# Patient Record
Sex: Female | Born: 1985 | Race: Black or African American | Hispanic: No | Marital: Single | Smoking: Never smoker
Health system: Southern US, Community
[De-identification: ages and names within clinical notes are randomized; demographics above are authoritative.]

## PROBLEM LIST (undated history)

## (undated) DIAGNOSIS — A64 Unspecified sexually transmitted disease: Secondary | ICD-10-CM

## (undated) HISTORY — PX: TUBAL LIGATION: SHX77

## (undated) HISTORY — DX: Unspecified sexually transmitted disease: A64

---

## 2005-10-17 ENCOUNTER — Emergency Department: Payer: Self-pay | Admitting: Emergency Medicine

## 2007-09-04 ENCOUNTER — Emergency Department: Payer: Self-pay | Admitting: Emergency Medicine

## 2007-09-07 ENCOUNTER — Ambulatory Visit: Payer: Self-pay

## 2007-09-14 ENCOUNTER — Ambulatory Visit: Payer: Self-pay

## 2008-01-25 LAB — HM PAP SMEAR: HM Pap smear: NEGATIVE

## 2008-03-20 ENCOUNTER — Ambulatory Visit: Payer: Self-pay | Admitting: Gynecology

## 2008-03-27 ENCOUNTER — Ambulatory Visit (HOSPITAL_COMMUNITY): Admission: RE | Admit: 2008-03-27 | Discharge: 2008-03-27 | Payer: Self-pay | Admitting: Gynecology

## 2008-04-12 ENCOUNTER — Ambulatory Visit: Payer: Self-pay | Admitting: Gynecology

## 2008-04-12 ENCOUNTER — Other Ambulatory Visit: Admission: RE | Admit: 2008-04-12 | Discharge: 2008-04-12 | Payer: Self-pay | Admitting: Gynecology

## 2008-04-12 ENCOUNTER — Encounter (INDEPENDENT_AMBULATORY_CARE_PROVIDER_SITE_OTHER): Payer: Self-pay | Admitting: Gynecology

## 2008-04-17 ENCOUNTER — Ambulatory Visit (HOSPITAL_COMMUNITY): Admission: RE | Admit: 2008-04-17 | Discharge: 2008-04-17 | Payer: Self-pay | Admitting: Gynecology

## 2008-05-04 ENCOUNTER — Ambulatory Visit (HOSPITAL_COMMUNITY): Admission: RE | Admit: 2008-05-04 | Discharge: 2008-05-04 | Payer: Self-pay | Admitting: Gynecology

## 2008-05-10 ENCOUNTER — Ambulatory Visit: Payer: Self-pay | Admitting: Obstetrics and Gynecology

## 2008-06-08 ENCOUNTER — Ambulatory Visit: Payer: Self-pay | Admitting: Gynecology

## 2008-06-22 ENCOUNTER — Ambulatory Visit: Payer: Self-pay | Admitting: Obstetrics & Gynecology

## 2008-06-23 ENCOUNTER — Ambulatory Visit (HOSPITAL_COMMUNITY): Admission: RE | Admit: 2008-06-23 | Discharge: 2008-06-23 | Payer: Self-pay | Admitting: Gynecology

## 2008-07-12 ENCOUNTER — Ambulatory Visit: Payer: Self-pay | Admitting: Obstetrics and Gynecology

## 2008-07-26 ENCOUNTER — Ambulatory Visit: Payer: Self-pay | Admitting: Obstetrics and Gynecology

## 2008-08-09 ENCOUNTER — Ambulatory Visit: Payer: Self-pay | Admitting: Family Medicine

## 2008-08-17 ENCOUNTER — Encounter: Payer: Self-pay | Admitting: Obstetrics & Gynecology

## 2008-08-17 ENCOUNTER — Ambulatory Visit: Payer: Self-pay | Admitting: Obstetrics & Gynecology

## 2008-08-17 LAB — CONVERTED CEMR LAB: GC Probe Amp, Genital: NEGATIVE

## 2008-08-18 ENCOUNTER — Encounter: Payer: Self-pay | Admitting: Obstetrics & Gynecology

## 2008-08-23 ENCOUNTER — Ambulatory Visit: Payer: Self-pay | Admitting: Obstetrics and Gynecology

## 2008-08-28 ENCOUNTER — Ambulatory Visit: Payer: Self-pay | Admitting: Obstetrics & Gynecology

## 2008-08-28 ENCOUNTER — Inpatient Hospital Stay (HOSPITAL_COMMUNITY): Admission: AD | Admit: 2008-08-28 | Discharge: 2008-08-30 | Payer: Self-pay | Admitting: Obstetrics & Gynecology

## 2008-08-29 ENCOUNTER — Encounter: Payer: Self-pay | Admitting: Obstetrics & Gynecology

## 2009-01-03 IMAGING — US US OB COMP +14 WK
1 series · 14 of 27 positions shown · non-contrast
Comparison: none

OBSTETRICAL ULTRASOUND:
 This ultrasound exam was performed in the [HOSPITAL] Ultrasound Department.  The OB US report was generated in the AS system, and faxed to the ordering physician.  This report is also available in [REDACTED] PACS.

[Series 1: us ob comp +14 wk · 0.28mm/px · 14 of 27 slices shown]
[im 1/27]
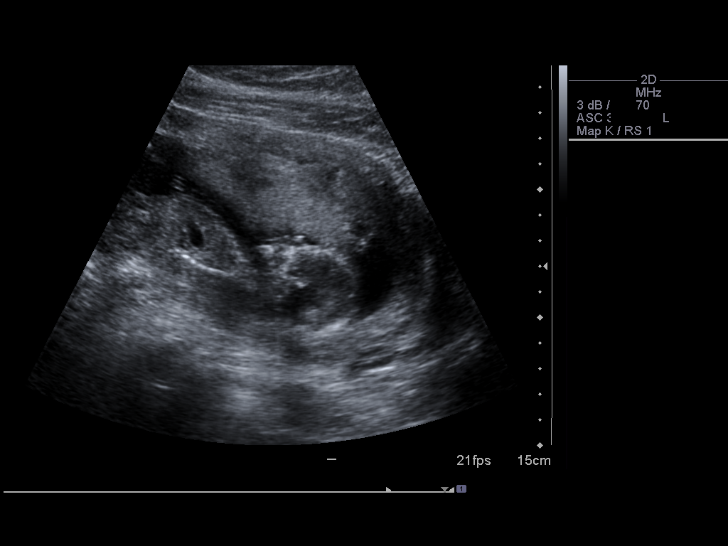
[im 3/27]
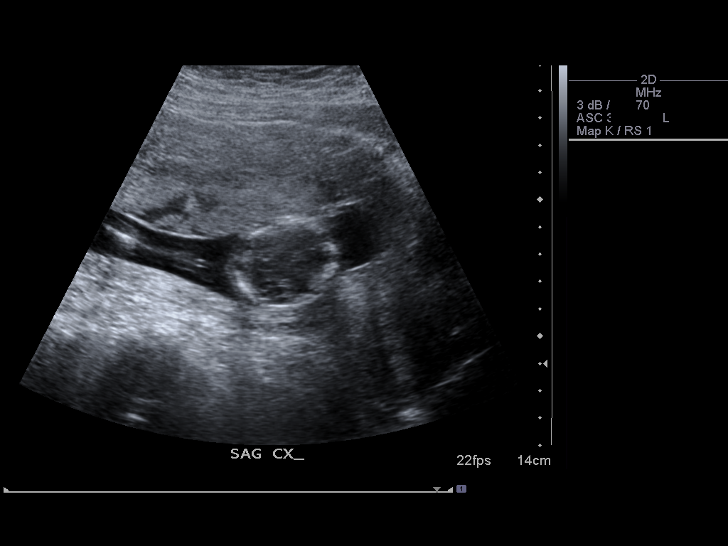
[im 5/27]
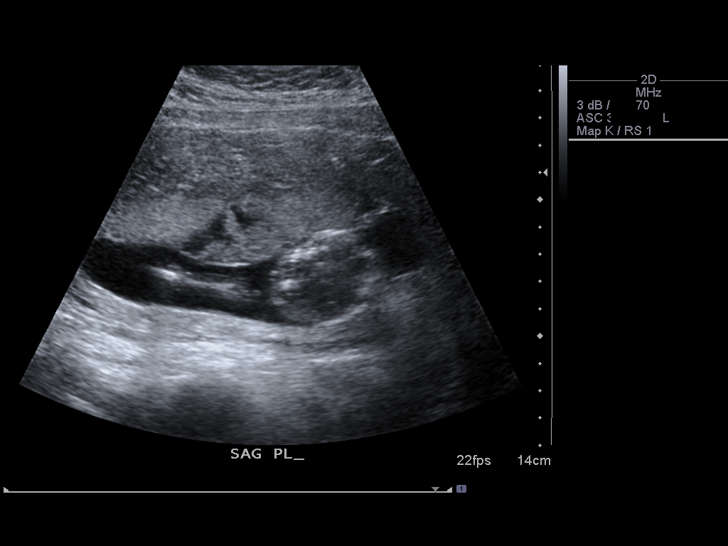
[im 7/27]
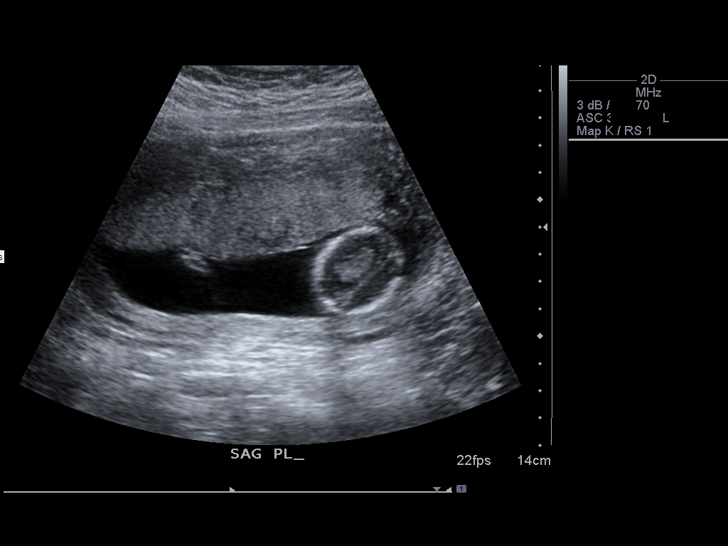
[im 9/27]
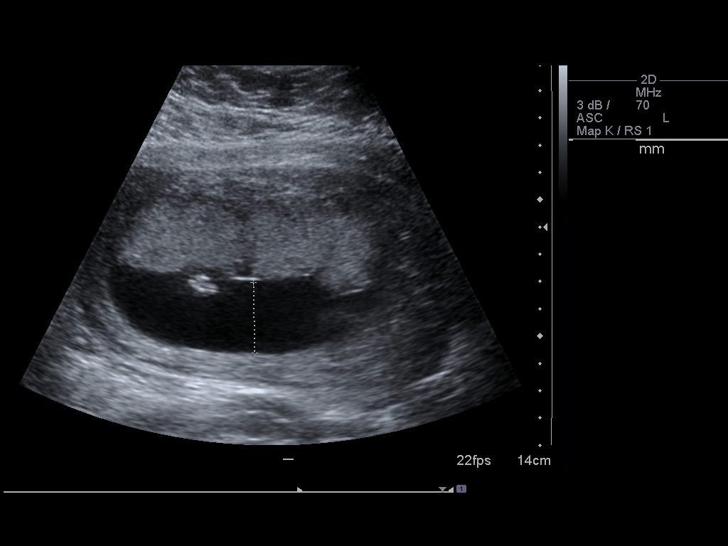
[im 11/27]
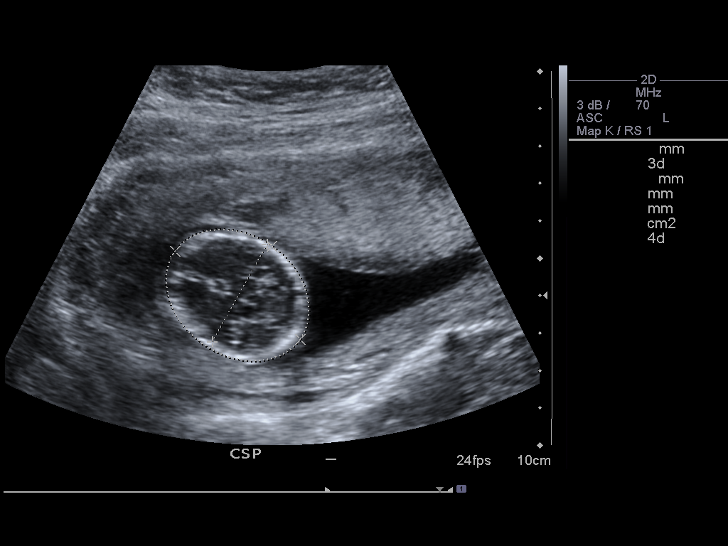
[im 13/27]
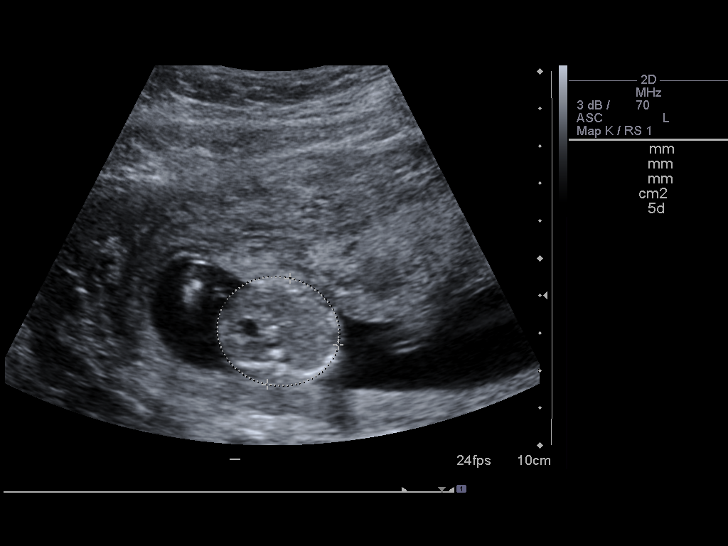
[im 15/27]
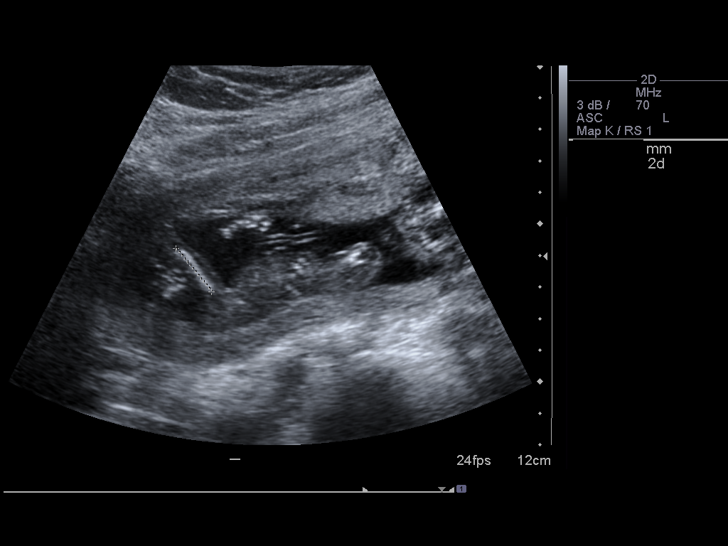
[im 17/27]
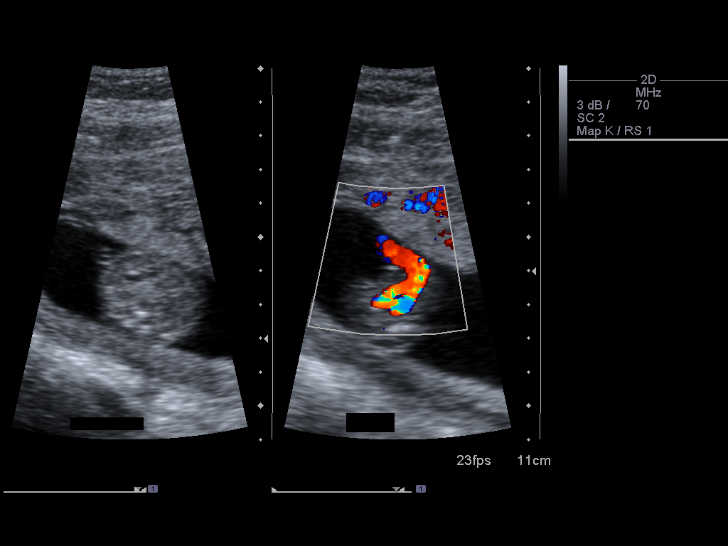
[im 19/27]
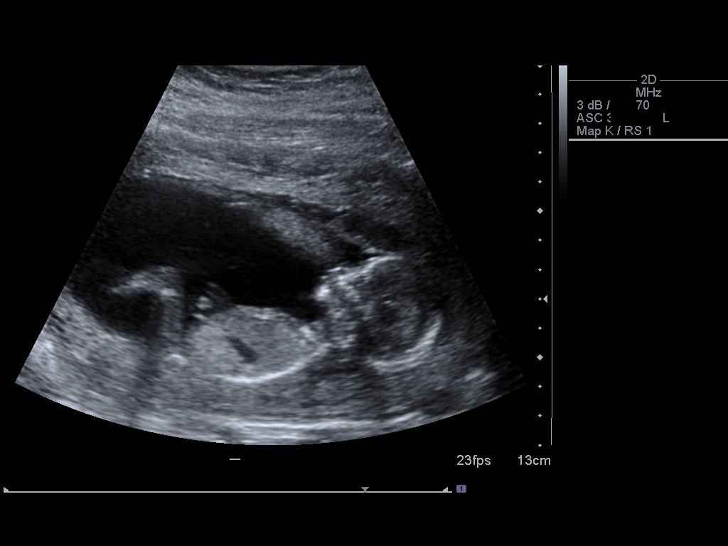
[im 21/27]
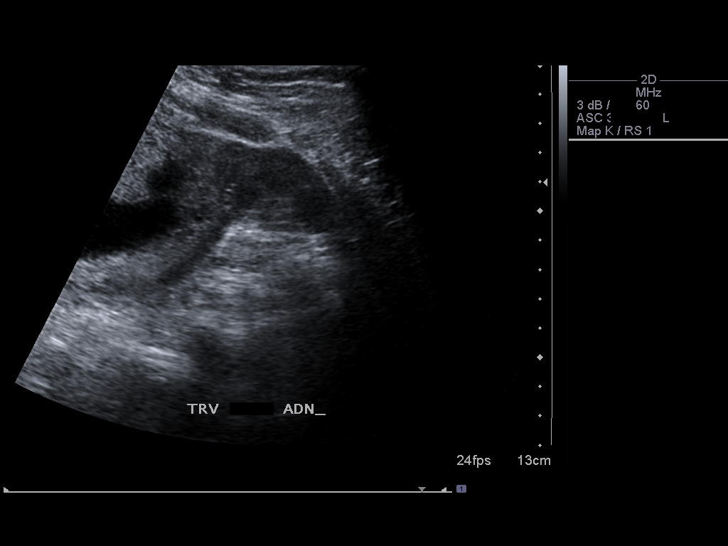
[im 23/27]
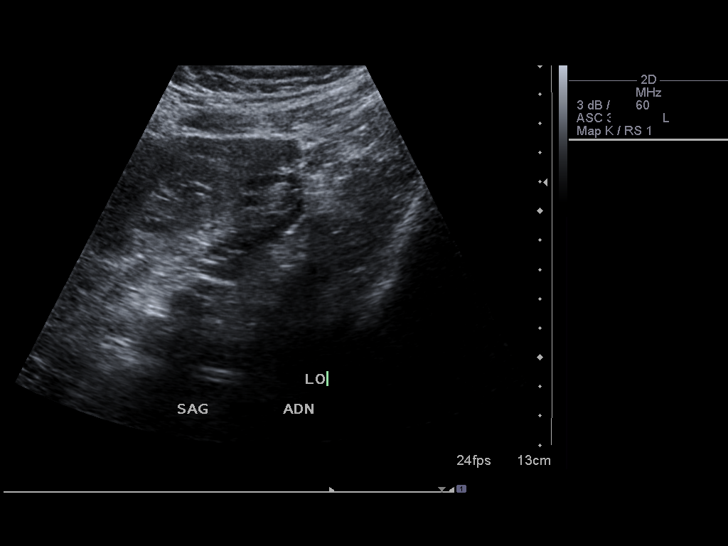
[im 25/27]
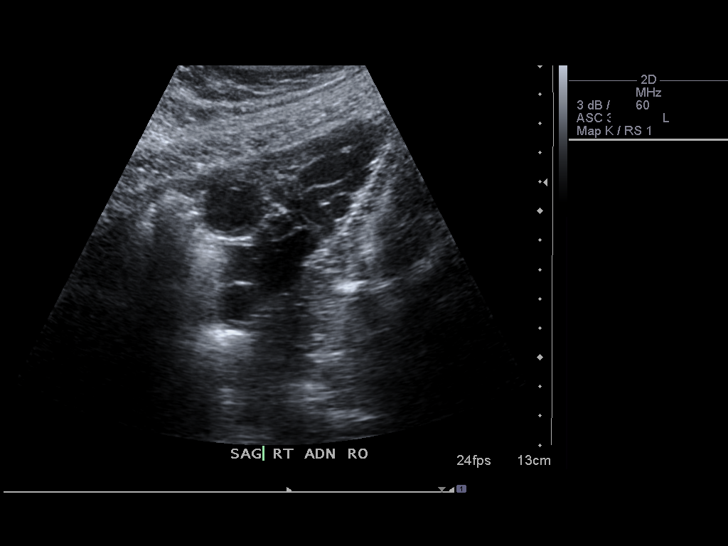
[im 27/27]
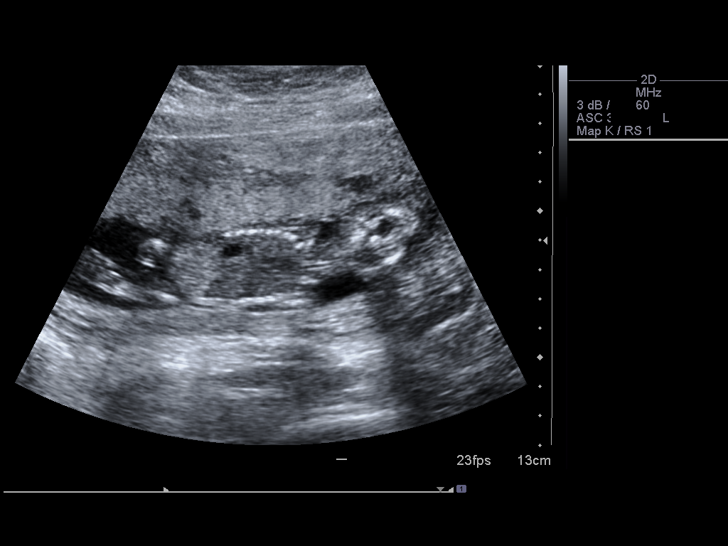

[14 of 27 positions shown; findings below may reference images not displayed]

IMPRESSION: See AS Obstetric US report.

## 2009-01-24 IMAGING — US US OB DETAIL+14 WK
1 series · 2 of 2 positions shown · non-contrast
Comparison: none

OBSTETRICAL ULTRASOUND:
 This ultrasound exam was performed in the [HOSPITAL] Ultrasound Department.  The OB US report was generated in the AS system, and faxed to the ordering physician.  This report is also available in [REDACTED] PACS.

[Series 1: us ob detail+14 wk · 0.28mm/px · 2 of 2 slices shown]
[im 1/2]
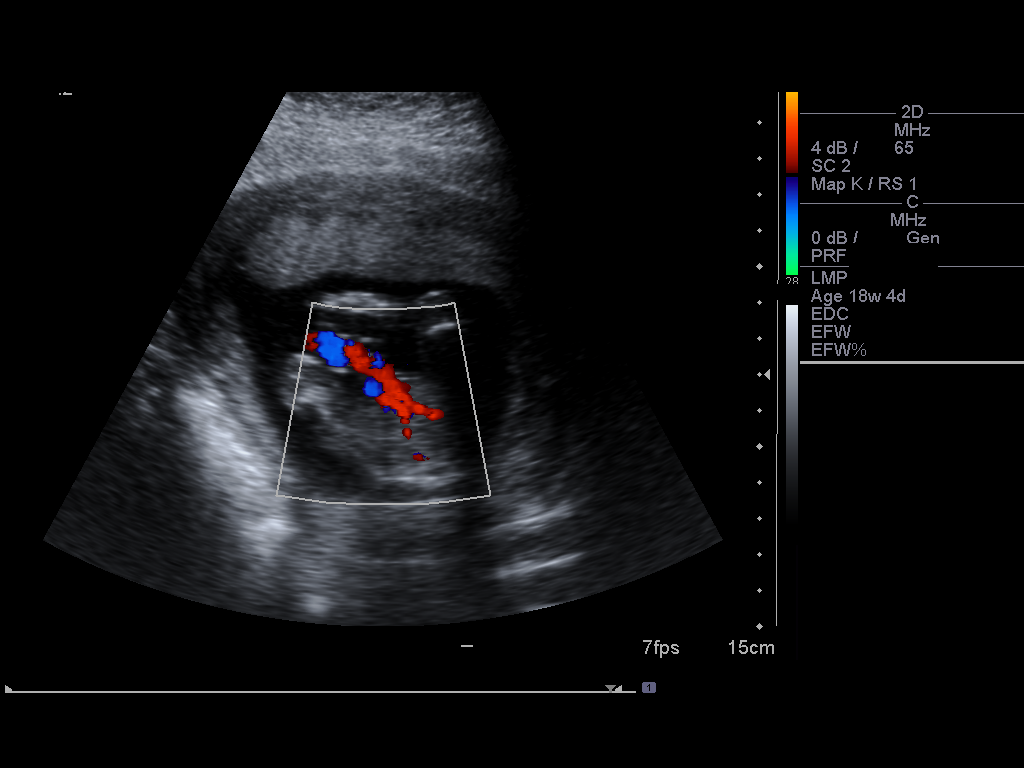
[im 2/2]
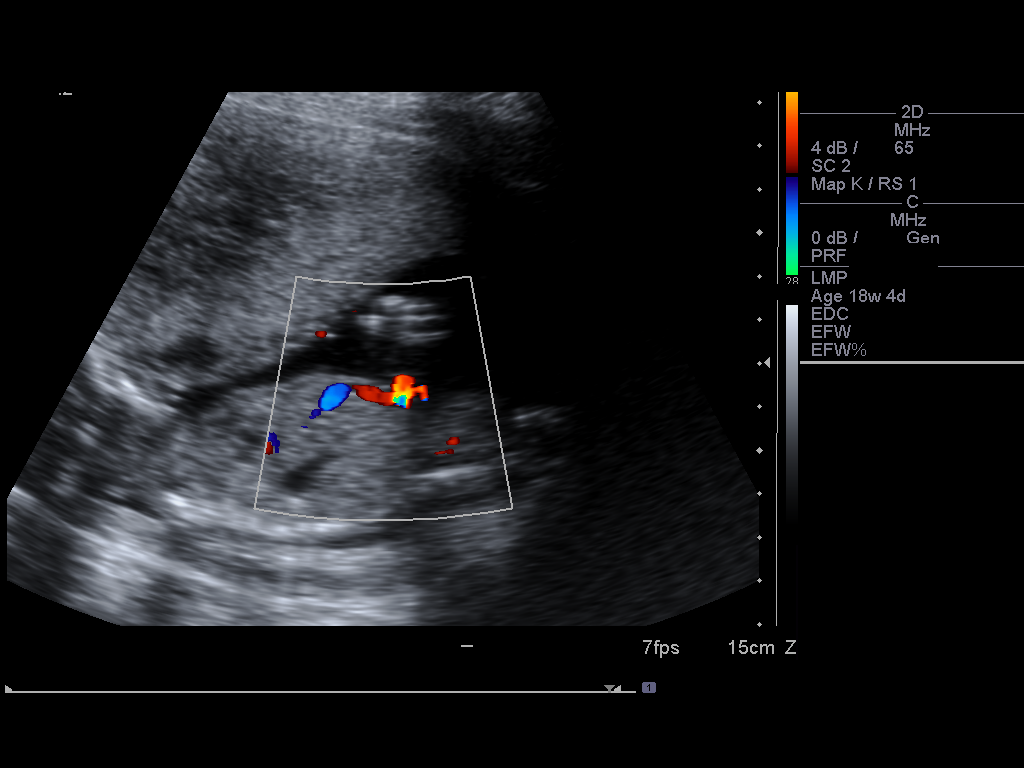

[2 of 2 positions shown; findings below may reference images not displayed]

IMPRESSION: See AS Obstetric US report.

## 2009-04-24 ENCOUNTER — Emergency Department: Payer: Self-pay | Admitting: Emergency Medicine

## 2011-01-28 NOTE — Op Note (Signed)
NAMENOHELIA, VALENZA NO.:  [id number]   MEDICAL RECORD NO.:  [id number]          PATIENT TYPE:  INP   LOCATION:                                FACILITY:  [facility]   PHYSICIAN:  Lazaro Arms, M.D.   DATE OF BIRTH:  1986-03-28   DATE OF PROCEDURE:  08/29/2008  DATE OF DISCHARGE:  08/30/2008                               OPERATIVE REPORT   PREOPERATIVE DIAGNOSIS:  Multiparous female, desires permanent  sterilization.   POSTOPERATIVE DIAGNOSIS:  Multiparous females, desires permanent  sterilization.   PROCEDURE:  Postpartum bilateral tubal ligation using modified Pomeroy  technique.   SURGEON:  Lazaro Arms, MD   ASSISTANT:  Odie Sera, DO   ANESTHESIA:  Dosed up epidural from labor.   FINDINGS:  The patient had normal postpartum uterus, tubes, and ovaries.  Intraperitoneal as far as we could see was normal.   DESCRIPTION OF OPERATION:  The patient was taken to the operating room  and placed in the sitting position, had her epidural dosed up and it  worked well.  It was the one she had placed for labor.  When adequate  anesthesia was obtained, she was prepped and draped in the usual sterile  fashion.  An incision was made in an elliptical fashion below her  umbilicus, carried down sharp to the rectus fascia, which was scored  sharply with a scalpel.  Peritoneal cavity was entered sharply without  difficulty and the right fallopian tube was identified and grasped.  Modified Pomeroy bilateral tubal ligations were performed.  A 2.5-cm  segment tube was removed with good hemostasis.  The left fallopian tube  was then identified and modified Pomeroy technique was used.  A 2.5-cm  segment removed and with good hemostasis.  Tubal segments were sent to  pathology for evaluation.  The perineal fascia were closed with single 0-  Vicryl running suture, and the skin was closed using 4-0 Vicryl in  subcuticular fashion.  Using a Mellody Dance needle, local anesthetic was  placed.  The patient tolerated the procedure well.  She experienced  minimal blood loss.  Taken to recovery room in good, stable condition.  All counts were correct x3.      Lazaro Arms, M.D.  Electronically Signed     LHE/MEDQ  D:  08/29/2008  T:  08/30/2008  Job:  [id number]

## 2011-06-20 LAB — CBC
HCT: 35.4 % — ABNORMAL LOW (ref 36.0–46.0)
MCHC: 34.5 g/dL (ref 30.0–36.0)
MCV: 89.3 fL (ref 78.0–100.0)
RBC: 3.94 MIL/uL (ref 3.87–5.11)
RDW: 14.3 % (ref 11.5–15.5)

## 2013-04-01 ENCOUNTER — Emergency Department: Payer: Self-pay | Admitting: Emergency Medicine

## 2013-04-01 LAB — CBC
MCH: 27.3 pg (ref 26.0–34.0)
MCV: 82 fL (ref 80–100)

## 2013-04-01 LAB — COMPREHENSIVE METABOLIC PANEL
Albumin: 3 g/dL — ABNORMAL LOW (ref 3.4–5.0)
Alkaline Phosphatase: 87 U/L (ref 50–136)
Anion Gap: 8 (ref 7–16)
Chloride: 108 mmol/L — ABNORMAL HIGH (ref 98–107)
Co2: 22 mmol/L (ref 21–32)
Creatinine: 0.86 mg/dL (ref 0.60–1.30)
Osmolality: 276 (ref 275–301)
Potassium: 4.2 mmol/L (ref 3.5–5.1)
SGPT (ALT): 21 U/L (ref 12–78)
Sodium: 138 mmol/L (ref 136–145)
Total Protein: 7 g/dL (ref 6.4–8.2)

## 2013-04-01 LAB — URINALYSIS, COMPLETE
Blood: NEGATIVE
Ketone: NEGATIVE
Nitrite: NEGATIVE
WBC UR: 5 /HPF (ref 0–5)

## 2013-10-11 ENCOUNTER — Emergency Department: Payer: Self-pay | Admitting: Emergency Medicine

## 2013-10-22 ENCOUNTER — Emergency Department: Payer: Self-pay | Admitting: Internal Medicine

## 2015-07-17 ENCOUNTER — Emergency Department
Admission: EM | Admit: 2015-07-17 | Discharge: 2015-07-17 | Disposition: A | Discharge status: Home / Self Care | Payer: 59 | Attending: Emergency Medicine | Admitting: Emergency Medicine

## 2015-07-17 ENCOUNTER — Encounter: Payer: Self-pay | Admitting: Emergency Medicine

## 2015-07-17 DIAGNOSIS — L02211 Cutaneous abscess of abdominal wall: Secondary | ICD-10-CM | POA: Diagnosis not present

## 2015-07-17 DIAGNOSIS — L0291 Cutaneous abscess, unspecified: Secondary | ICD-10-CM

## 2015-07-17 DIAGNOSIS — L03311 Cellulitis of abdominal wall: Secondary | ICD-10-CM | POA: Insufficient documentation

## 2015-07-17 DIAGNOSIS — L0390 Cellulitis, unspecified: Secondary | ICD-10-CM

## 2015-07-17 MED ORDER — CEPHALEXIN 500 MG PO CAPS
ORAL_CAPSULE | ORAL | Status: AC
Start: 2015-07-17 — End: 2015-07-17
  Administered 2015-07-17: 500 mg via ORAL
  Filled 2015-07-17: qty 1

## 2015-07-17 MED ORDER — LIDOCAINE HCL (PF) 1 % IJ SOLN
INTRAMUSCULAR | Status: AC
  Administered 2015-07-17: 5 mL via INTRADERMAL
  Filled 2015-07-17: qty 5

## 2015-07-17 MED ORDER — CEPHALEXIN 500 MG PO CAPS
500.0000 mg | ORAL_CAPSULE | Freq: Once | ORAL | Status: AC
  Administered 2015-07-17: 500 mg via ORAL

## 2015-07-17 MED ORDER — LIDOCAINE HCL (PF) 1 % IJ SOLN
5.0000 mL | Freq: Once | INTRAMUSCULAR | Status: AC
  Administered 2015-07-17: 5 mL via INTRADERMAL

## 2015-07-17 MED ORDER — CEPHALEXIN 500 MG PO CAPS: 500.0000 mg | ORAL_CAPSULE | Freq: Two times a day (BID) | ORAL | Status: AC

## 2015-07-17 NOTE — ED Notes (Signed)
pt reports abscess to back that started last wednesday states has not gotten better and painful

## 2015-07-17 NOTE — Discharge Instructions (Signed)

## 2015-07-17 NOTE — ED Provider Notes (Signed)
[facility] Emergency Department Provider Note  ____________________________________________  Time seen: 1:30 AM  I have reviewed the triage vital signs and the nursing notes.   HISTORY  Chief Complaint Abscess     HPI KERRI-ANNE HAEBERLE is a 29 y.o. female presents with abscess on left flank 5 days that has progressively become more painful. Patient denies any fever. Patient denies any history of abscesses in the past.    Past medical history None There are no active problems to display for this patient.   Past surgical history None No current outpatient prescriptions on file.  Allergies No known drug allergies History reviewed. No pertinent family history.  Social History Social History  Substance Use Topics  . Smoking status: Never Smoker   . Smokeless tobacco: None  . Alcohol Use: Yes     Comment: social    Review of Systems  Constitutional: Negative for fever. Eyes: Negative for visual changes. ENT: Negative for sore throat. Cardiovascular: Negative for chest pain. Respiratory: Negative for shortness of breath. Gastrointestinal: Negative for abdominal pain, vomiting and diarrhea. Genitourinary: Negative for dysuria. Musculoskeletal: Negative for back pain. Skin: Positive for left flank abscess Neurological: Negative for headaches, focal weakness or numbness.   10-point ROS otherwise negative.  ____________________________________________   PHYSICAL EXAM:  VITAL SIGNS: ED Triage Vitals  Enc Vitals Group     BP 07/17/15 0040 117/68 mmHg     Pulse Rate 07/17/15 0040 98     Resp --      Temp 07/17/15 0040 98.2 F (36.8 C)     Temp Source 07/17/15 0040 Oral     SpO2 07/17/15 0040 98 %     Weight 07/17/15 0040 260 lb (117.935 kg)     Height 07/17/15 0040 5\' 8"  (1.727 m)     Head Cir --      Peak Flow --      Pain Score 07/17/15 0041 8     Pain Loc --      Pain Edu? --      Excl. in [place]? --      Constitutional:  Alert and oriented. Well appearing and in no distress. Eyes: Conjunctivae are normal. PERRL. Normal extraocular movements. ENT   Head: Normocephalic and atraumatic.   Nose: No congestion/rhinnorhea.   Mouth/Throat: Mucous membranes are moist.   Neck: No stridor. Hematological/Lymphatic/Immunilogical: No cervical lymphadenopathy. Cardiovascular: Normal rate, regular rhythm. Normal and symmetric distal pulses are present in all extremities. No murmurs, rubs, or gallops. Respiratory: Normal respiratory effort without tachypnea nor retractions. Breath sounds are clear and equal bilaterally. No wheezes/rales/rhonchi. Gastrointestinal: Soft and nontender. No distention. There is no CVA tenderness. Genitourinary: deferred Musculoskeletal: Nontender with normal range of motion in all extremities. No joint effusions.  No lower extremity tenderness nor edema. Neurologic:  Normal speech and language. No gross focal neurologic deficits are appreciated. Speech is normal.  Skin:  Positive for left flank 5 x 5 cm area of induration flocculence and erythema. Psychiatric: Mood and affect are normal. Speech and behavior are normal. Patient exhibits appropriate insight and judgment.  Procedure note:INCISION AND DRAINAGE Performed by: Marjean Donna N Consent: Verbal consent obtained. Risks and benefits: risks, benefits and alternatives were discussed Type: abscess  Body area: left flank   Anesthesia: local infiltration  Incision was made with a scalpel.  Local anesthetic: lidocaine 1%   Anesthetic total: 89ml  Complexity: complex Blunt dissection to break up loculations  Drainage: purulent  Drainage amount: 56ml  Patient tolerance:  Patient tolerated the procedure well with no immediate complications.     INITIAL IMPRESSION / ASSESSMENT AND PLAN / ED COURSE  Pertinent labs & imaging results that were available during my care of the patient were reviewed by me and considered in  my medical decision making (see chart for details).  Patient received Keflex 500 mg.  ____________________________________________   FINAL CLINICAL IMPRESSION(S) / ED DIAGNOSES  Final diagnoses:  Abscess and cellulitis      Gregor Hams, MD 07/17/15 [phone]

## 2015-11-27 ENCOUNTER — Encounter: Payer: Self-pay | Admitting: Emergency Medicine

## 2015-11-27 ENCOUNTER — Emergency Department
Admission: EM | Admit: 2015-11-27 | Discharge: 2015-11-27 | Disposition: A | Discharge status: Home / Self Care | Payer: 59 | Attending: Student | Admitting: Student

## 2015-11-27 DIAGNOSIS — J020 Streptococcal pharyngitis: Secondary | ICD-10-CM | POA: Diagnosis not present

## 2015-11-27 DIAGNOSIS — J029 Acute pharyngitis, unspecified: Secondary | ICD-10-CM | POA: Diagnosis present

## 2015-11-27 LAB — POCT RAPID STREP A: STREPTOCOCCUS, GROUP A SCREEN (DIRECT): POSITIVE — AB

## 2015-11-27 MED ORDER — AMOXICILLIN 500 MG PO CAPS: 500.0000 mg | ORAL_CAPSULE | Freq: Three times a day (TID) | ORAL | Status: DC

## 2015-11-27 MED ORDER — LIDOCAINE VISCOUS 2 % MT SOLN
15.0000 mL | Freq: Once | OROMUCOSAL | Status: AC
  Administered 2015-11-27: 15 mL via OROMUCOSAL
  Filled 2015-11-27: qty 15

## 2015-11-27 MED ORDER — PSEUDOEPH-BROMPHEN-DM 30-2-10 MG/5ML PO SYRP: 5.0000 mL | ORAL_SOLUTION | Freq: Four times a day (QID) | ORAL | Status: DC | PRN

## 2015-11-27 MED ORDER — AMOXICILLIN 500 MG PO CAPS
500.0000 mg | ORAL_CAPSULE | Freq: Once | ORAL | Status: AC
  Administered 2015-11-27: 500 mg via ORAL
  Filled 2015-11-27: qty 1

## 2015-11-27 MED ORDER — LIDOCAINE-EPINEPHRINE (PF) 1 %-1:200000 IJ SOLN
INTRAMUSCULAR | Status: AC
  Filled 2015-11-27: qty 30

## 2015-11-27 MED ORDER — LIDOCAINE VISCOUS 2 % MT SOLN: 5.0000 mL | Freq: Four times a day (QID) | OROMUCOSAL | Status: DC | PRN

## 2015-11-27 MED ORDER — DIPHENHYDRAMINE HCL 12.5 MG/5ML PO ELIX
25.0000 mg | ORAL_SOLUTION | Freq: Once | ORAL | Status: AC
  Administered 2015-11-27: 25 mg via ORAL
  Filled 2015-11-27: qty 10

## 2015-11-27 NOTE — ED Provider Notes (Signed)
[facility] Emergency Department Provider Note  ____________________________________________  Time seen: Approximately 10:14 PM  I have reviewed the triage vital signs and the nursing notes.   HISTORY  Chief Complaint Sore Throat    HPI Sherry Mack is a 30 y.o. female patient complaining of sore throat for 2-3 days. Patient also states she's having nasal congestion intermittently rhinorrhea. Patient also complaining of a nonproductive cough. Patient denies any chills, nausea, vomiting, or diarrhea. No palliative measures taken for this complaint. Patient rates her pain discomfort as 8/10. Patient state her pain increase with eating solid foods. Patient able to tolerate fluids.   History reviewed. No pertinent past medical history.  There are no active problems to display for this patient.   History reviewed. No pertinent past surgical history.  Current Outpatient Rx  Name  Route  Sig  Dispense  Refill  . amoxicillin (AMOXIL) 500 MG capsule   Oral   Take 1 capsule (500 mg total) by mouth 3 (three) times daily.   30 capsule   0   . brompheniramine-pseudoephedrine-DM 30-2-10 MG/5ML syrup   Oral   Take 5 mLs by mouth 4 (four) times daily as needed. Medical 5 mL of viscous lidocaine swish and swallow.   120 mL   0   . lidocaine (XYLOCAINE) 2 % solution   Mouth/Throat   Use as directed 5 mLs in the mouth or throat every 6 (six) hours as needed for mouth pain. Mixed with 5 mL of Bromfed-DM for swish and swallow.   100 mL   0     Allergies Review of patient's allergies indicates no known allergies.  No family history on file.  Social History Social History  Substance Use Topics  . Smoking status: Never Smoker   . Smokeless tobacco: None  . Alcohol Use: Yes     Comment: social    Review of Systems Constitutional: No fever/chills Eyes: No visual changes. ENT: Sore throat and nasal congestion.  Cardiovascular: Denies chest  pain. Respiratory: Denies shortness of breath. Productive cough Gastrointestinal: No abdominal pain.  No nausea, no vomiting.  No diarrhea.  No constipation. Genitourinary: Negative for dysuria. Musculoskeletal: Negative for back pain. Skin: Negative for rash. Neurological: Negative for headaches, focal weakness or numbness.    ____________________________________________   PHYSICAL EXAM:  VITAL SIGNS: ED Triage Vitals  Enc Vitals Group     BP 11/27/15 2121 133/90 mmHg     Pulse Rate 11/27/15 2121 99     Resp 11/27/15 2121 20     Temp 11/27/15 2121 98.7 F (37.1 C)     Temp Source 11/27/15 2121 Oral     SpO2 11/27/15 2121 99 %     Weight 11/27/15 2121 260 lb (117.935 kg)     Height 11/27/15 2121 5\' 9"  (1.753 m)     Head Cir --      Peak Flow --      Pain Score 11/27/15 2120 8     Pain Loc --      Pain Edu? --      Excl. in [place]? --     Constitutional: Alert and oriented. Well appearing and in no acute distress. Eyes: Conjunctivae are normal. PERRL. EOMI. Head: Atraumatic. Nose: Edematous nasal terms of clear rhinorrhea Mouth/Throat: Mucous membranes are moist.  Oropharynx erythematous. Edematous bilateral tonsils without exudate. Neck: No stridor.  No cervical spine tenderness to palpation. Hematological/Lymphatic/Immunilogical: Bilateral cervical lymphadenopathy. Cardiovascular: Normal rate, regular rhythm. Grossly normal heart sounds.  Good peripheral circulation. Respiratory: Normal respiratory effort.  No retractions. Lungs CTAB. Nonproductive cough Gastrointestinal: Soft and nontender. No distention. No abdominal bruits. No CVA tenderness. Musculoskeletal: No lower extremity tenderness nor edema.  No joint effusions. Neurologic:  Normal speech and language. No gross focal neurologic deficits are appreciated. No gait instability. Skin:  Skin is warm, dry and intact. No rash noted. Psychiatric: Mood and affect are normal. Speech and behavior are  normal.  ____________________________________________   LABS (all labs ordered are listed, but only abnormal results are displayed)  Labs Reviewed  POCT RAPID STREP A - Abnormal; Notable for the following:    Streptococcus, Group A Screen (Direct) POSITIVE (*)    All other components within normal limits   ____________________________________________  EKG   ____________________________________________  RADIOLOGY   ____________________________________________   PROCEDURES  Procedure(s) performed: None  Critical Care performed: No  ____________________________________________   INITIAL IMPRESSION / ASSESSMENT AND PLAN / ED COURSE  Pertinent labs & imaging results that were available during my care of the patient were reviewed by me and considered in my medical decision making (see chart for details).  Strep pharyngitis. She given discharge care instructions. Patient given a prescription for Amoxil, Bromfed-DM, and viscous lidocaine. Patient given a work note for one day. Patient [facility]. ____________________________________________   FINAL CLINICAL IMPRESSION(S) / ED DIAGNOSES  Final diagnoses:  Strep pharyngitis      Sable Feil, PA-C 11/27/15 2220  Joanne Gavel, MD 11/27/15 2350

## 2015-11-27 NOTE — ED Notes (Signed)
Patient ambulatory to triage with steady gait, without difficulty or distress noted; pt reports sore throat x 2-3 days with congestion

## 2016-06-12 ENCOUNTER — Ambulatory Visit (INDEPENDENT_AMBULATORY_CARE_PROVIDER_SITE_OTHER): Payer: 59 | Admitting: Obstetrics and Gynecology

## 2016-06-12 ENCOUNTER — Encounter: Payer: Self-pay | Admitting: Obstetrics and Gynecology

## 2016-06-12 VITALS — BP 100/68 | HR 82 | Ht 66.0 in | Wt 264.5 lb

## 2016-06-12 DIAGNOSIS — Z9851 Tubal ligation status: Secondary | ICD-10-CM | POA: Diagnosis not present

## 2016-06-12 DIAGNOSIS — Z01419 Encounter for gynecological examination (general) (routine) without abnormal findings: Secondary | ICD-10-CM | POA: Diagnosis not present

## 2016-06-12 DIAGNOSIS — R5382 Chronic fatigue, unspecified: Secondary | ICD-10-CM | POA: Diagnosis not present

## 2016-06-12 DIAGNOSIS — N946 Dysmenorrhea, unspecified: Secondary | ICD-10-CM

## 2016-06-12 DIAGNOSIS — Z202 Contact with and (suspected) exposure to infections with a predominantly sexual mode of transmission: Secondary | ICD-10-CM

## 2016-06-12 NOTE — Patient Instructions (Addendum)
1. Pap smear is done 2. Self breast awareness is encouraged 3. Screening labs are ordered 4. Healthy eating with exercise and controlled weight loss with goal of 12 pound Weight loss in 1 year 5. Return in 1 year for annual exam with

## 2016-06-12 NOTE — Progress Notes (Signed)
ANNUAL PREVENTATIVE CARE GYN  ENCOUNTER NOTE  Subjective:       Sherry Mack is a 30 y.o. G44P3003 Single African-American female, status post tubal ligation, here for a routine annual gynecologic exam.  Current complaints: 1.  none   10-15 pound weight gain over the past year Patient reports chronic fatigue Menses are heavier since getting tubal ligation; patient discontinued OCPs after tubal ligation   Gynecologic History Patient's last menstrual period was 05/31/2016 (approximate). Contraception: tubal ligation Last Pap: 2014. Results were: normal Last mammogram: na Intervals: Monthly Duration: 4 days Bleeding: Moderate with clots Dysmenorrhea: Mild, responding to Midol Dyspareunia: Negative Pap smear history: No abnormals   Obstetric History OB History  Gravida Para Term Preterm AB Living  3 3 3     3   SAB TAB Ectopic Multiple Live Births          3    # Outcome Date GA Lbr Len/2nd Weight Sex Delivery Anes PTL Lv  3 Term 2009   9 lb 6.4 oz (4.264 kg) M Vag-Spont   LIV  2 Term 2003   9 lb (4.082 kg) M Vag-Spont   LIV  1 Term 2001   8 lb 6.4 oz (3.81 kg) F Vag-Spont   LIV      Past Medical History:  Diagnosis Date  . STD (sexually transmitted disease)    20+ years ago    Past Surgical History:  Procedure Laterality Date  . TUBAL LIGATION      No current outpatient prescriptions on file prior to visit.   No current facility-administered medications on file prior to visit.     No Known Allergies  Social History   Social History  . Marital status: Single    Spouse name: N/A  . Number of children: N/A  . Years of education: N/A   Occupational History  . Not on file.   Social History Main Topics  . Smoking status: Never Smoker  . Smokeless tobacco: Not on file  . Alcohol use Yes     Comment: social  . Drug use: Unknown  . Sexual activity: Not on file   Other Topics Concern  . Not on file   Social History Narrative  . No narrative on file     No family history on file.  The following portions of the patient's history were reviewed and updated as appropriate: allergies, current medications, past family history, past medical history, past social history, past surgical history and problem list.  Review of Systems ROS Review of Systems - General ROS: negative for - chills, fatigue, fever, hot flashes, night sweats, weight gain or weight loss Psychological ROS: negative for - anxiety, decreased libido, depression, mood swings, physical abuse or sexual abuse Ophthalmic ROS: negative for - blurry vision, eye pain or loss of vision ENT ROS: negative for - headaches, hearing change, visual changes or vocal changes Allergy and Immunology ROS: negative for - hives, itchy/watery eyes or seasonal allergies Hematological and Lymphatic ROS: negative for - bleeding problems, bruising, swollen lymph nodes or weight loss Endocrine ROS: negative for - galactorrhea, hair pattern changes, hot flashes, malaise/lethargy, mood swings, palpitations, polydipsia/polyuria, skin changes, temperature intolerance or unexpected weight changes Breast ROS: negative for - new or changing breast lumps or nipple discharge Respiratory ROS: negative for - cough or shortness of breath Cardiovascular ROS: negative for - chest pain, irregular heartbeat, palpitations or shortness of breath Gastrointestinal ROS: no abdominal pain, change in bowel habits, or black or bloody  stools Genito-Urinary ROS: no dysuria, trouble voiding, or hematuria Musculoskeletal ROS: negative for - joint pain or joint stiffness Neurological ROS: negative for - bowel and bladder control changes Dermatological ROS: negative for rash and skin lesion changes   Objective:   BP 100/68   Pulse 82   Ht 5\' 6"  (1.676 m)   Wt 264 lb 8 oz (120 kg)   LMP 05/31/2016 (Approximate)   BMI 42.69 kg/m  CONSTITUTIONAL: Well-developed, well-nourished female in no acute distress.  PSYCHIATRIC: Normal  mood and affect. Normal behavior. Normal judgment and thought content. [place]: Alert and oriented to person, place, and time. Normal muscle tone coordination. No cranial nerve deficit noted. HENT:  Normocephalic, atraumatic, External right and left ear normal. Oropharynx is clear and moist EYES: Conjunctivae and EOM are normal. Pupils are equal, round, and reactive to light. No scleral icterus.  NECK: Normal range of motion, supple, no masses.  Normal thyroid.  SKIN: Skin is warm and dry. No rash noted. Not diaphoretic. No erythema. No pallor. CARDIOVASCULAR: Normal heart rate noted, regular rhythm, no murmur. RESPIRATORY: Clear to auscultation bilaterally. Effort and breath sounds normal, no problems with respiration noted. BREASTS: Symmetric in size. No masses, skin changes, nipple drainage, or lymphadenopathy. ABDOMEN: Soft, normal bowel sounds, no distention noted.  No tenderness, rebound or guarding.  BLADDER: Normal PELVIC:  External Genitalia: Normal  BUS: Normal  Vagina: Normal  Cervix: Normal; parous, located high in the vaginal vault; no cervical motion tenderness  Uterus: Normal; midplane, not enlarged, nontender; nonpalpable and nontender  Adnexa: Normal  RV: External Exam NormaI  MUSCULOSKELETAL: Normal range of motion. No tenderness.  No cyanosis, clubbing, or edema.  2+ distal pulses. LYMPHATIC: No Axillary, Supraclavicular, or Inguinal Adenopathy.    Assessment:   Annual gynecologic examination 30 y.o. Contraception: tubal ligation bmi- 42 Dysmenorrhea Fatigue  Plan:  Pap: Pap Co Test Mammogram: Not Indicated Stool Guaiac Testing:  Not Indicated Labs: Lipid 1, FBS, TSH, Hemoglobin A1C and Vit D Level"". CBC Routine preventative health maintenance measures emphasized: Exercise/Diet/Weight control, Tobacco Warnings, Alcohol/Substance use risks and Safe Sex Healthy eating with exercise greater than 45 minutes a day to help with controlled weight loss; goal is 1  pound per month Return to [place], CMA  Brayton Mars, MD  Note: This dictation was prepared with Dragon dictation along with smaller phrase technology. Any transcriptional errors that result from this process are unintentional.

## 2016-06-18 LAB — PAP IG, CT-NG NAA, HPV HIGH-RISK
Chlamydia, Nuc. Acid Amp: NEGATIVE
GONOCOCCUS BY NUCLEIC ACID AMP: NEGATIVE
HPV, high-risk: NEGATIVE
PAP Smear Comment: 0

## 2016-06-20 ENCOUNTER — Telehealth: Payer: Self-pay | Admitting: Obstetrics and Gynecology

## 2016-06-20 NOTE — Telephone Encounter (Signed)
Called for results. Please call pt back. She wants to ask some questions about her labs.

## 2016-06-23 ENCOUNTER — Telehealth: Payer: Self-pay | Admitting: Obstetrics and Gynecology

## 2016-06-23 NOTE — Telephone Encounter (Signed)
THIS PT RETURNED YOUR CALL

## 2016-06-23 NOTE — Telephone Encounter (Signed)
Pt aware trich was noted in pap. Needs appt for wet prep to confirm. Advised she will get meds if its confirmed.

## 2016-06-23 NOTE — Telephone Encounter (Signed)
Lmtrc

## 2016-06-24 ENCOUNTER — Ambulatory Visit (INDEPENDENT_AMBULATORY_CARE_PROVIDER_SITE_OTHER): Payer: 59 | Admitting: Obstetrics and Gynecology

## 2016-06-24 ENCOUNTER — Encounter: Payer: Self-pay | Admitting: Obstetrics and Gynecology

## 2016-06-24 VITALS — BP 103/68 | HR 78 | Ht 65.0 in | Wt 264.0 lb

## 2016-06-24 DIAGNOSIS — A599 Trichomoniasis, unspecified: Secondary | ICD-10-CM

## 2016-06-24 DIAGNOSIS — Z202 Contact with and (suspected) exposure to infections with a predominantly sexual mode of transmission: Secondary | ICD-10-CM | POA: Diagnosis not present

## 2016-06-24 MED ORDER — METRONIDAZOLE 500 MG PO TABS: 500.0000 mg | ORAL_TABLET | Freq: Two times a day (BID) | ORAL | 0 refills | Status: DC

## 2016-06-24 NOTE — Progress Notes (Signed)
Chief complaint: 1. Confirm Trichomonas from Pap smear 2. Needs STI screening, not completed  Patient asymptomatic. She did have possible STI exposure. Labs need to be dry.  OBJECTIVE: BP 103/68   Pulse 78   Ht 5\' 5"  (1.651 m)   Wt 264 lb (119.7 kg)   LMP 06/15/2016 (Approximate)   BMI 43.93 kg/m  Pelvic: External genitalia normal BUS normal Vagina-minimal gray-white secretions in vaginal vault Cervix-no lesions Bimanual-  PROCEDURE: Wet prep Normal saline-positive for white blood cells and motile Trichomonas  ASSESSMENT: 1. Trichomonas vaginalis, confirmed on wet prep 2. Needs complete STI screen  PLAN: 1. Flagyl 500 mg twice a day for 7 days 2. Avoid alcohol 3. Partner is to be treated simultaneously. Patient understands the necessity of this in order to avoid recurrent infection  A total of 15 minutes were spent face-to-face with the patient during this encounter and over half of that time dealt with counseling and coordination of care.  Brayton Mars, MD  Note: This dictation was prepared with Dragon dictation along with smaller phrase technology. Any transcriptional errors that result from this process are unintentional.

## 2016-06-24 NOTE — Patient Instructions (Signed)
1. Take Flagyl as directed for 7 days 2. Avoid alcohol while taking Flagyl 3. Partner needs to be treated simultaneously before resuming relations

## 2016-06-30 ENCOUNTER — Telehealth: Payer: Self-pay | Admitting: Obstetrics and Gynecology

## 2016-06-30 NOTE — Telephone Encounter (Signed)
[place] REQUESTED A CALL INTO YOU AND A CALL BACK ABOUT THIS PATIENT WITH HER VISIT ON 06/24/16.

## 2016-07-01 LAB — HIV ANTIBODY (ROUTINE TESTING W REFLEX): HIV Screen 4th Generation wRfx: NONREACTIVE

## 2016-07-01 LAB — TSH: TSH: 1.21 u[IU]/mL (ref 0.450–4.500)

## 2016-07-01 LAB — HSV(HERPES SIMPLEX VRS) I + II AB-IGG
HSV 1 Glycoprotein G Ab, IgG: 27.8 index — ABNORMAL HIGH (ref 0.00–0.90)
HSV 2 Glycoprotein G Ab, IgG: 5.36 index — ABNORMAL HIGH (ref 0.00–0.90)

## 2016-07-01 LAB — GLUCOSE, FASTING: Glucose, Plasma: 107 mg/dL — ABNORMAL HIGH (ref 65–99)

## 2016-07-01 LAB — LIPID PANEL
CHOL/HDL RATIO: 6.1 ratio — AB (ref 0.0–4.4)
Cholesterol, Total: 158 mg/dL (ref 100–199)
HDL: 26 mg/dL — AB (ref >39)
LDL Calculated: 97 mg/dL (ref 0–99)
TRIGLYCERIDES: 175 mg/dL — AB (ref 0–149)
VLDL Cholesterol Cal: 35 mg/dL (ref 5–40)

## 2016-07-01 LAB — CBC
HEMATOCRIT: 37.1 % (ref 34.0–46.6)
HEMOGLOBIN: 12.2 g/dL (ref 11.1–15.9)
MCH: 27.1 pg (ref 26.6–33.0)
MCHC: 32.9 g/dL (ref 31.5–35.7)
MCV: 82 fL (ref 79–97)
PLATELETS: 298 10*3/uL (ref 150–379)
RBC: 4.51 x10E6/uL (ref 3.77–5.28)
RDW: 14.9 % (ref 12.3–15.4)
WBC: 9.4 10*3/uL (ref 3.4–10.8)

## 2016-07-01 LAB — HEMOGLOBIN A1C
ESTIMATED AVERAGE GLUCOSE: 117 mg/dL
HEMOGLOBIN A1C: 5.7 % — AB (ref 4.8–5.6)

## 2016-07-01 LAB — VITAMIN D 25 HYDROXY (VIT D DEFICIENCY, FRACTURES): Vit D, 25-Hydroxy: 8.5 ng/mL — ABNORMAL LOW (ref 30.0–100.0)

## 2016-07-01 LAB — RPR, QUANT+TP ABS (REFLEX): TREPONEMA PALLIDUM AB: NEGATIVE

## 2016-07-01 LAB — RPR: RPR Ser Ql: REACTIVE — AB

## 2016-07-01 LAB — HEPATITIS C ANTIBODY

## 2016-07-01 LAB — HEPATITIS B SURFACE ANTIGEN: HEP B S AG: NEGATIVE

## 2016-07-01 NOTE — Telephone Encounter (Signed)
Sherry Mack contacted yesterday and test result not back as yet but I called him today with results of T. Pallidum which were negative.

## 2016-08-01 ENCOUNTER — Telehealth: Payer: Self-pay | Admitting: Obstetrics and Gynecology

## 2016-08-01 NOTE — Telephone Encounter (Signed)
lmtrc

## 2016-08-01 NOTE — Telephone Encounter (Signed)
Pt called and she was prescribed and antibiotic from Dr Tennis Must and she thinks she has yeast infection. Itching, burning, and its on the outside of vaginal wall, she wasn't sure if she needed to come in or if something could be called in, I did tell her Dr Tennis Must was off today and out of town next week. Pt would like a call back.

## 2016-08-01 NOTE — Telephone Encounter (Signed)
Pt did call office back. When I got to the phone the call was dropped. Have tried to call pt x 2 got vm.

## 2016-08-01 NOTE — Telephone Encounter (Signed)
Pt states she is itchy. Having a cottage cheese d/c. NO change in detergents or body wash.  Advised monistat OTC for 7 nites. If still having sx to make an appt on day 9.

## 2017-02-25 ENCOUNTER — Encounter: Payer: Self-pay | Admitting: *Deleted

## 2017-02-25 ENCOUNTER — Encounter: Payer: 59 | Admitting: Certified Nurse Midwife

## 2017-02-25 ENCOUNTER — Emergency Department
Admission: EM | Admit: 2017-02-25 | Discharge: 2017-02-26 | Disposition: A | Discharge status: Home / Self Care | Payer: PRIVATE HEALTH INSURANCE | Attending: Emergency Medicine | Admitting: Emergency Medicine

## 2017-02-25 DIAGNOSIS — Z202 Contact with and (suspected) exposure to infections with a predominantly sexual mode of transmission: Secondary | ICD-10-CM | POA: Insufficient documentation

## 2017-02-25 LAB — URINALYSIS, COMPLETE (UACMP) WITH MICROSCOPIC
BACTERIA UA: NONE SEEN
BILIRUBIN URINE: NEGATIVE
Glucose, UA: NEGATIVE mg/dL
Ketones, ur: NEGATIVE mg/dL
Nitrite: NEGATIVE
Protein, ur: 30 mg/dL — AB
SPECIFIC GRAVITY, URINE: 1.021 (ref 1.005–1.030)
pH: 8 (ref 5.0–8.0)

## 2017-02-25 LAB — WET PREP, GENITAL
Clue Cells Wet Prep HPF POC: NONE SEEN
Sperm: NONE SEEN
Trich, Wet Prep: NONE SEEN
Yeast Wet Prep HPF POC: NONE SEEN

## 2017-02-25 LAB — POCT PREGNANCY, URINE: PREG TEST UR: NEGATIVE

## 2017-02-25 MED ORDER — CEFTRIAXONE SODIUM 250 MG IJ SOLR
250.0000 mg | Freq: Once | INTRAMUSCULAR | Status: AC
  Administered 2017-02-26: 250 mg via INTRAMUSCULAR

## 2017-02-25 MED ORDER — AZITHROMYCIN 500 MG PO TABS
1000.0000 mg | ORAL_TABLET | Freq: Once | ORAL | Status: AC
  Administered 2017-02-26: 1000 mg via ORAL

## 2017-02-25 MED ORDER — METRONIDAZOLE 500 MG PO TABS
2000.0000 mg | ORAL_TABLET | Freq: Once | ORAL | Status: AC
  Administered 2017-02-26: 2000 mg via ORAL

## 2017-02-25 MED ORDER — CEPHALEXIN 500 MG PO CAPS: 500.0000 mg | ORAL_CAPSULE | Freq: Three times a day (TID) | ORAL | 0 refills | Status: AC

## 2017-02-25 NOTE — ED Triage Notes (Signed)
Pt has vaginal irritation and itching  No vag bleeding.  Pt denies urinary sx.  No n/v/d.  Pt alert.

## 2017-02-26 LAB — CHLAMYDIA/NGC RT PCR (ARMC ONLY)
Chlamydia Tr: NOT DETECTED
N gonorrhoeae: NOT DETECTED

## 2017-02-26 NOTE — ED Provider Notes (Signed)
[facility] Emergency Department Provider Note  ____________________________________________  Time seen: Approximately 4:07 PM  I have reviewed the triage vital signs and the nursing notes.   HISTORY  Chief Complaint Vaginal Itching    HPI Sherry Mack is a 31 y.o. female presents to the emergency department with dysuria for the past 3 days. Patient denies increased urinary frequency. Patient is currently having her menses. She denies bilateral flank pain, nausea or vomiting. Patient had one instance of unprotected sex approximately 1 month ago. Patient has a history of STDs. She denies dyspareunia. Patient denies vaginal itching. Triage note noted. She has been afebrile and denies chills. No alleviating measures have been attempted.    Past Medical History:  Diagnosis Date  . STD (sexually transmitted disease)    20+ years ago- trich in pap 06/12/2016    Patient Active Problem List   Diagnosis Date Noted  . Trichomonas vaginalis infection 06/24/2016  . Chronic fatigue 06/12/2016  . Obesity, Class III, BMI 40-49.9 (morbid obesity) ([place]) 06/12/2016  . Dysmenorrhea 06/12/2016  . Status post tubal ligation 06/12/2016  . Potential exposure to STD 06/12/2016  . Well woman exam with routine gynecological exam 06/12/2016    Past Surgical History:  Procedure Laterality Date  . TUBAL LIGATION      Prior to Admission medications   Medication Sig Start Date End Date Taking? Authorizing Provider  cephALEXin (KEFLEX) 500 MG capsule Take 1 capsule (500 mg total) by mouth 3 (three) times daily. 02/25/17 03/07/17  Lannie Fields, PA-C  metroNIDAZOLE (FLAGYL) 500 MG tablet Take 1 tablet (500 mg total) by mouth 2 (two) times daily. 06/24/16   Defrancesco, Alanda Slim, MD    Allergies Patient has no known allergies.  Family History  Problem Relation Age of Onset  . Breast cancer Neg Hx   . Ovarian cancer Neg Hx   . Colon cancer Neg Hx   . Cancer Neg Hx   .  Heart disease Neg Hx     Social History Social History  Substance Use Topics  . Smoking status: Never Smoker  . Smokeless tobacco: Never Used  . Alcohol use Yes     Comment: social     Review of Systems  Constitutional: No fever/chills Eyes: No visual changes. No discharge ENT: No upper respiratory complaints. Cardiovascular: no chest pain. Respiratory: no cough. No SOB. Gastrointestinal: No abdominal pain.  No nausea, no vomiting.  No diarrhea.  No constipation. Genitourinary: Patient has dysuria and potential exposure to STDs Musculoskeletal: Negative for musculoskeletal pain. Skin: Negative for rash, abrasions, lacerations, ecchymosis. Neurological: Negative for headaches, focal weakness or numbness.  ____________________________________________   PHYSICAL EXAM:  VITAL SIGNS: ED Triage Vitals  Enc Vitals Group     BP 02/25/17 2157 124/67     Pulse Rate 02/25/17 2157 87     Resp 02/25/17 2157 18     Temp 02/25/17 2157 98.4 F (36.9 C)     Temp Source 02/25/17 2157 Oral     SpO2 02/25/17 2157 98 %     Weight 02/25/17 2158 250 lb (113.4 kg)     Height 02/25/17 2158 5\' 6"  (1.676 m)     Head Circumference --      Peak Flow --      Pain Score 02/25/17 2156 0     Pain Loc --      Pain Edu? --      Excl. in [place]? --      Constitutional: Alert  and oriented. Well appearing and in no acute distress. Eyes: Conjunctivae are normal. PERRL. EOMI. Head: Atraumatic. Cardiovascular: Normal rate, regular rhythm. Normal S1 and S2.  Good peripheral circulation. Respiratory: Normal respiratory effort without tachypnea or retractions. Lungs CTAB. Good air entry to the bases with no decreased or absent breath sounds. Gastrointestinal: Bowel sounds 4 quadrants. Soft and nontender to palpation. No guarding or rigidity. No palpable masses. No distention. No CVA tenderness. Genitourinary: No cervical motion tenderness. No palpable masses. Musculoskeletal: Full range of motion to all  extremities. No gross deformities appreciated. Neurologic:  Normal speech and language. No gross focal neurologic deficits are appreciated.  Skin:  Skin is warm, dry and intact. No rash noted. Psychiatric: Mood and affect are normal. Speech and behavior are normal. Patient exhibits appropriate insight and judgement.   ____________________________________________   LABS (all labs ordered are listed, but only abnormal results are displayed)  Labs Reviewed  WET PREP, GENITAL - Abnormal; Notable for the following:       Result Value   WBC, Wet Prep HPF POC MODERATE (*)    All other components within normal limits  URINALYSIS, COMPLETE (UACMP) WITH MICROSCOPIC - Abnormal; Notable for the following:    Color, Urine YELLOW (*)    APPearance HAZY (*)    Hgb urine dipstick MODERATE (*)    Protein, ur 30 (*)    Leukocytes, UA MODERATE (*)    Squamous Epithelial / LPF 0-5 (*)    All other components within normal limits  CHLAMYDIA/NGC RT PCR ([facility] ONLY)  POC URINE PREG, ED  POCT PREGNANCY, URINE   ____________________________________________  EKG   ____________________________________________  RADIOLOGY   No results found.  ____________________________________________    PROCEDURES  Procedure(s) performed:    Procedures    Medications  cefTRIAXone (ROCEPHIN) injection 250 mg (250 mg Intramuscular Given 02/26/17 0006)  azithromycin (ZITHROMAX) tablet 1,000 mg (1,000 mg Oral Given 02/26/17 0003)  metroNIDAZOLE (FLAGYL) tablet 2,000 mg (2,000 mg Oral Given 02/26/17 0002)     ____________________________________________   INITIAL IMPRESSION / ASSESSMENT AND PLAN / ED COURSE  Pertinent labs & imaging results that were available during my care of the patient were reviewed by me and considered in my medical decision making (see chart for details).  Review of the [place] CSRS was performed in accordance of the [place] prior to dispensing any controlled drugs.      Assessment and Plan:  STD exposure Patient presents to the emergency department with history of unprotected sex and dysuria. Patient was treated empirically for gonorrhea, chlamydia and trichomoniasis in the emergency department. Patient was also discharged with Keflex. She was advised to follow-up with her local department for additional STD testing. Patient voiced understanding. Vital signs are reassuring prior to discharge. All patient questions were answered.  ____________________________________________  FINAL CLINICAL IMPRESSION(S) / ED DIAGNOSES  Final diagnoses:  STD exposure      NEW MEDICATIONS STARTED DURING THIS VISIT:  Discharge Medication List as of 02/25/2017 11:54 PM    START taking these medications   Details  cephALEXin (KEFLEX) 500 MG capsule Take 1 capsule (500 mg total) by mouth 3 (three) times daily., Starting Wed 02/25/2017, Until Sat 03/07/2017, Print            This chart was dictated using voice recognition software/Dragon. Despite best efforts to proofread, errors can occur which can change the meaning. Any change was purely unintentional.    Lannie Fields, PA-C 02/26/17 1637    Drenda Freeze,  MD 02/26/17 2014

## 2017-06-11 NOTE — Progress Notes (Signed)
ANNUAL PREVENTATIVE CARE GYN  ENCOUNTER NOTE  Subjective:       Sherry Mack is a 32 y.o. G2P3003 Single African-American female, status post tubal ligation, here for a routine annual gynecologic exam.  Current complaints: 1.  No acute complaints 2. Wants std testing   Menstrual cycles are regular, pt endorses dysmenorrhea that is well controlled with ibuprofen/midol. History of STDs, recently visited ED in 02/2017 for screening. Treated empirically but GC testing negative. Requests STD screen today as well. Denies vaginal abnormal vaginal bleeding, discharge, dyspareunia, or dysuria. Currently sexually active with one partner, no condom use. Denies bowel or bladder changes.    Gynecologic History No LMP recorded. Contraception: tubal ligation Last Pap:05/2016- neg/neg -pos for trich. Results were: normal Last mammogram: na Intervals: Monthly Duration: 4 days Bleeding: Moderate with clots Dysmenorrhea: Mild, responding to Midol Dyspareunia: Negative Pap smear history: No abnormals   Obstetric History OB History  Gravida Para Term Preterm AB Living  3 3 3     3   SAB TAB Ectopic Multiple Live Births          3    # Outcome Date GA Lbr Len/2nd Weight Sex Delivery Anes PTL Lv  3 Term 2009   9 lb 6.4 oz (4.264 kg) M Vag-Spont   LIV  2 Term 2003   9 lb (4.082 kg) M Vag-Spont   LIV  1 Term 2001   8 lb 6.4 oz (3.81 kg) F Vag-Spont   LIV      Past Medical History:  Diagnosis Date  . STD (sexually transmitted disease)    20+ years ago- trich in pap 06/12/2016    Past Surgical History:  Procedure Laterality Date  . TUBAL LIGATION      Current Outpatient Prescriptions on File Prior to Visit  Medication Sig Dispense Refill  . metroNIDAZOLE (FLAGYL) 500 MG tablet Take 1 tablet (500 mg total) by mouth 2 (two) times daily. 14 tablet 0   No current facility-administered medications on file prior to visit.     No Known Allergies  Social History   Social History  . Marital  status: Single    Spouse name: N/A  . Number of children: N/A  . Years of education: N/A   Occupational History  . Not on file.   Social History Main Topics  . Smoking status: Never Smoker  . Smokeless tobacco: Never Used  . Alcohol use Yes     Comment: social  . Drug use: No  . Sexual activity: Yes    Birth control/ protection: Surgical   Other Topics Concern  . Not on file   Social History Narrative  . No narrative on file    Family History  Problem Relation Age of Onset  . Breast cancer Neg Hx   . Ovarian cancer Neg Hx   . Colon cancer Neg Hx   . Cancer Neg Hx   . Heart disease Neg Hx     The following portions of the patient's history were reviewed and updated as appropriate: allergies, current medications, past family history, past medical history, past social history, past surgical history and problem list.  Review of Systems Review of Systems  Constitutional: Negative for chills and fever.  HENT: Negative for ear pain, hearing loss and sore throat.   Eyes: Negative for pain.  Respiratory: Negative for cough, shortness of breath and wheezing.   Cardiovascular: Negative for chest pain, palpitations, claudication and leg swelling.  Gastrointestinal: Negative for abdominal pain,  constipation, diarrhea, nausea and vomiting.  Genitourinary: Negative for dysuria, frequency, hematuria and urgency.  Musculoskeletal: Negative for myalgias.  Skin: Negative for rash.  Neurological: Negative for dizziness, loss of consciousness, weakness and headaches.     Objective:   BP 96/62   Pulse 79   Ht 5\' 6"  (1.676 m)   Wt 250 lb 11.2 oz (113.7 kg)   LMP 06/09/2017 (Approximate)   BMI 40.46 kg/m  CONSTITUTIONAL: Well-developed, well-nourished female in no acute distress.  PSYCHIATRIC: Normal mood and affect. Normal behavior. Normal judgment and thought content. [place]: Alert and oriented to person, place, and time. Normal muscle tone coordination. No cranial nerve  deficit noted. HENT:  Normocephalic, atraumatic, External right and left ear normal. Oropharynx is clear and moist  EYES: Conjunctivae and EOM are normal. Pupils are equal, round, and reactive to light. No scleral icterus.  NECK: Normal range of motion, supple, no masses.  Normal thyroid.  SKIN: Skin is warm and dry. No rash noted. Not diaphoretic. No erythema. No pallor. CARDIOVASCULAR: Normal heart rate noted, regular rhythm, no murmur. RESPIRATORY: Clear to auscultation bilaterally. Effort and breath sounds normal, no problems with respiration noted. BREASTS: Symmetric in size. No masses, skin changes, nipple drainage, or lymphadenopathy. ABDOMEN: Soft, normal bowel sounds, no distention noted.  No tenderness, rebound or guarding.  BLADDER: Normal PELVIC:  External Genitalia: Normal  BUS: Normal  Vagina: Normal; no significant discharge  Cervix: Normal; parous, located low in the vaginal vault; no cervical motion tenderness  Uterus: Normal; midplane, top normal size, nontender; nonpalpable and nontender  Adnexa: Normal  RV: External Exam NormaI  MUSCULOSKELETAL: Normal range of motion. No tenderness.  No cyanosis, clubbing, or edema.  2+ DP/PT pulses.  LYMPHATIC: No Axillary, Supraclavicular, cervical or Inguinal Adenopathy.    Assessment:   Annual gynecologic examination 31 y.o. Contraception: tubal ligation bmi- 42 Dysmenorrhea Fatigue   Plan:  Pap: DUE 2020 Mammogram: Not Indicated Stool Guaiac Testing:  Not Indicated  Labs: STD labs, Lipid 1, FBS, TSH and Hemoglobin A1C  Routine preventative health maintenance measures emphasized: Exercise/Diet/Weight control, Tobacco Warnings, Alcohol/Substance use risks and Safe Sex Healthy eating with exercise greater than 45 minutes a day to help with controlled weight loss; goal is 1 pound per month Return to [place], CMA  [employer], PA-S Brayton Mars, MD   I have seen, interviewed, and  examined the patient in conjunction with the [employer].A. student and affirm the diagnosis and management plan. Martin A. DeFrancesco, MD, FACOG    Note: This dictation was prepared with Dragon dictation along with smaller phrase technology. Any transcriptional errors that result from this process are unintentional.

## 2017-06-16 ENCOUNTER — Encounter: Payer: Self-pay | Admitting: Obstetrics and Gynecology

## 2017-06-16 ENCOUNTER — Ambulatory Visit (INDEPENDENT_AMBULATORY_CARE_PROVIDER_SITE_OTHER): Payer: Commercial Managed Care - PPO | Admitting: Obstetrics and Gynecology

## 2017-06-16 ENCOUNTER — Encounter: Payer: 59 | Admitting: Obstetrics and Gynecology

## 2017-06-16 VITALS — BP 96/62 | HR 79 | Ht 66.0 in | Wt 250.7 lb

## 2017-06-16 DIAGNOSIS — Z9851 Tubal ligation status: Secondary | ICD-10-CM

## 2017-06-16 DIAGNOSIS — N946 Dysmenorrhea, unspecified: Secondary | ICD-10-CM

## 2017-06-16 DIAGNOSIS — Z01419 Encounter for gynecological examination (general) (routine) without abnormal findings: Secondary | ICD-10-CM

## 2017-06-16 DIAGNOSIS — Z202 Contact with and (suspected) exposure to infections with a predominantly sexual mode of transmission: Secondary | ICD-10-CM | POA: Diagnosis not present

## 2017-06-16 NOTE — Patient Instructions (Signed)
1. No Pap smear until 2020 2. STD screening is obtained today through [facility] as well as blood work 3. Self breast awareness encouraged 4. Continue with healthy eating and exercise with controlled weight loss. Goal is 10 pound weight loss in 1 year 5. Screening labs are to be obtained including STD screening 6. Contraception-tubal ligation 7. Contraception-condom use encouraged if with new partners 8. Return in 1 year for annual exam  Health Maintenance, Female Adopting a healthy lifestyle and getting preventive care can go a long way to promote health and wellness. Talk with your health care provider about what schedule of regular examinations is right for you. This is a good chance for you to check in with your provider about disease prevention and staying healthy. In between checkups, there are plenty of things you can do on your own. Experts have done a lot of research about which lifestyle changes and preventive measures are most likely to keep you healthy. Ask your health care provider for more information. Weight and diet Eat a healthy diet  Be sure to include plenty of vegetables, fruits, low-fat dairy products, and lean protein.  Do not eat a lot of foods high in solid fats, added sugars, or salt.  Get regular exercise. This is one of the most important things you can do for your health. ? Most adults should exercise for at least 150 minutes each week. The exercise should increase your heart rate and make you sweat (moderate-intensity exercise). ? Most adults should also do strengthening exercises at least twice a week. This is in addition to the moderate-intensity exercise.  Maintain a healthy weight  Body mass index (BMI) is a measurement that can be used to identify possible weight problems. It estimates body fat based on height and weight. Your health care provider can help determine your BMI and help you achieve or maintain a healthy weight.  For females 32 years of age  and older: ? A BMI below 18.5 is considered underweight. ? A BMI of 18.5 to 24.9 is normal. ? A BMI of 25 to 29.9 is considered overweight. ? A BMI of 30 and above is considered obese.  Watch levels of cholesterol and blood lipids  You should start having your blood tested for lipids and cholesterol at 32 years of age, then have this test every 5 years.  You may need to have your cholesterol levels checked more often if: ? Your lipid or cholesterol levels are high. ? You are older than 32 years of age. ? You are at high risk for heart disease.  Cancer screening Lung Cancer  Lung cancer screening is recommended for adults 32-39 years old who are at high risk for lung cancer because of a history of smoking.  A yearly low-dose CT scan of the lungs is recommended for people who: ? Currently smoke. ? Have quit within the past 15 years. ? Have at least a 30-pack-year history of smoking. A pack year is smoking an average of one pack of cigarettes a day for 1 year.  Yearly screening should continue until it has been 15 years since you quit.  Yearly screening should stop if you develop a health problem that would prevent you from having lung cancer treatment.  Breast Cancer  Practice breast self-awareness. This means understanding how your breasts normally appear and feel.  It also means doing regular breast self-exams. Let your health care provider know about any changes, no matter how small.  If you are  in your 32s or 30s, you should have a clinical breast exam (CBE) by a health care provider every 1-3 years as part of a regular health exam.  If you are 87 or older, have a CBE every year. Also consider having a breast X-ray (mammogram) every year.  If you have a family history of breast cancer, talk to your health care provider about genetic screening.  If you are at high risk for breast cancer, talk to your health care provider about having an MRI and a mammogram every  year.  Breast cancer gene (BRCA) assessment is recommended for women who have family members with BRCA-related cancers. BRCA-related cancers include: ? Breast. ? Ovarian. ? Tubal. ? Peritoneal cancers.  Results of the assessment will determine the need for genetic counseling and BRCA1 and BRCA2 testing.  Cervical Cancer Your health care provider may recommend that you be screened regularly for cancer of the pelvic organs (ovaries, uterus, and vagina). This screening involves a pelvic examination, including checking for microscopic changes to the surface of your cervix (Pap test). You may be encouraged to have this screening done every 3 years, beginning at age 32.  For women ages 16-65, health care providers may recommend pelvic exams and Pap testing every 3 years, or they may recommend the Pap and pelvic exam, combined with testing for human papilloma virus (HPV), every 5 years. Some types of HPV increase your risk of cervical cancer. Testing for HPV may also be done on women of any age with unclear Pap test results.  Other health care providers may not recommend any screening for nonpregnant women who are considered low risk for pelvic cancer and who do not have symptoms. Ask your health care provider if a screening pelvic exam is right for you.  If you have had past treatment for cervical cancer or a condition that could lead to cancer, you need Pap tests and screening for cancer for at least 20 years after your treatment. If Pap tests have been discontinued, your risk factors (such as having a new sexual partner) need to be reassessed to determine if screening should resume. Some women have medical problems that increase the chance of getting cervical cancer. In these cases, your health care provider may recommend more frequent screening and Pap tests.  Colorectal Cancer  This type of cancer can be detected and often prevented.  Routine colorectal cancer screening usually begins at 32  years of age and continues through 32 years of age.  Your health care provider may recommend screening at an earlier age if you have risk factors for colon cancer.  Your health care provider may also recommend using home test kits to check for hidden blood in the stool.  A small camera at the end of a tube can be used to examine your colon directly (sigmoidoscopy or colonoscopy). This is done to check for the earliest forms of colorectal cancer.  Routine screening usually begins at age 43.  Direct examination of the colon should be repeated every 5-10 years through 32 years of age. However, you may need to be screened more often if early forms of precancerous polyps or small growths are found.  Skin Cancer  Check your skin from head to toe regularly.  Tell your health care provider about any new moles or changes in moles, especially if there is a change in a mole's shape or color.  Also tell your health care provider if you have a mole that is larger than the  size of a pencil eraser.  Always use sunscreen. Apply sunscreen liberally and repeatedly throughout the day.  Protect yourself by wearing long sleeves, pants, a wide-brimmed hat, and sunglasses whenever you are outside.  Heart disease, diabetes, and high blood pressure  High blood pressure causes heart disease and increases the risk of stroke. High blood pressure is more likely to develop in: ? People who have blood pressure in the high end of the normal range (130-139/85-89 mm Hg). ? People who are overweight or obese. ? People who are African American.  If you are 91-13 years of age, have your blood pressure checked every 3-5 years. If you are 59 years of age or older, have your blood pressure checked every year. You should have your blood pressure measured twice-once when you are at a hospital or clinic, and once when you are not at a hospital or clinic. Record the average of the two measurements. To check your blood pressure  when you are not at a hospital or clinic, you can use: ? An automated blood pressure machine at a pharmacy. ? A home blood pressure monitor.  If you are between 60 years and 67 years old, ask your health care provider if you should take aspirin to prevent strokes.  Have regular diabetes screenings. This involves taking a blood sample to check your fasting blood sugar level. ? If you are at a normal weight and have a low risk for diabetes, have this test once every three years after 32 years of age. ? If you are overweight and have a high risk for diabetes, consider being tested at a younger age or more often. Preventing infection Hepatitis B  If you have a higher risk for hepatitis B, you should be screened for this virus. You are considered at high risk for hepatitis B if: ? You were born in a country where hepatitis B is common. Ask your health care provider which countries are considered high risk. ? Your parents were born in a high-risk country, and you have not been immunized against hepatitis B (hepatitis B vaccine). ? You have HIV or AIDS. ? You use needles to inject street drugs. ? You live with someone who has hepatitis B. ? You have had sex with someone who has hepatitis B. ? You get hemodialysis treatment. ? You take certain medicines for conditions, including cancer, organ transplantation, and autoimmune conditions.  Hepatitis C  Blood testing is recommended for: ? Everyone born from [birth year] through 1966. ? Anyone with known risk factors for hepatitis C.  Sexually transmitted infections (STIs)  You should be screened for sexually transmitted infections (STIs) including gonorrhea and chlamydia if: ? You are sexually active and are younger than 32 years of age. ? You are older than 32 years of age and your health care provider tells you that you are at risk for this type of infection. ? Your sexual activity has changed since you were last screened and you are at an increased  risk for chlamydia or gonorrhea. Ask your health care provider if you are at risk.  If you do not have HIV, but are at risk, it may be recommended that you take a prescription medicine daily to prevent HIV infection. This is called pre-exposure prophylaxis (PrEP). You are considered at risk if: ? You are sexually active and do not regularly use condoms or know the HIV status of your partner(s). ? You take drugs by injection. ? You are sexually active with a partner  who has HIV.  Talk with your health care provider about whether you are at high risk of being infected with HIV. If you choose to begin PrEP, you should first be tested for HIV. You should then be tested every 3 months for as long as you are taking PrEP. Pregnancy  If you are premenopausal and you may become pregnant, ask your health care provider about preconception counseling.  If you may become pregnant, take 400 to 800 micrograms (mcg) of folic acid every day.  If you want to prevent pregnancy, talk to your health care provider about birth control (contraception). Osteoporosis and menopause  Osteoporosis is a disease in which the bones lose minerals and strength with aging. This can result in serious bone fractures. Your risk for osteoporosis can be identified using a bone density scan.  If you are 22 years of age or older, or if you are at risk for osteoporosis and fractures, ask your health care provider if you should be screened.  Ask your health care provider whether you should take a calcium or vitamin D supplement to lower your risk for osteoporosis.  Menopause may have certain physical symptoms and risks.  Hormone replacement therapy may reduce some of these symptoms and risks. Talk to your health care provider about whether hormone replacement therapy is right for you. Follow these instructions at home:  Schedule regular health, dental, and eye exams.  Stay current with your immunizations.  Do not use any  tobacco products including cigarettes, chewing tobacco, or electronic cigarettes.  If you are pregnant, do not drink alcohol.  If you are breastfeeding, limit how much and how often you drink alcohol.  Limit alcohol intake to no more than 1 drink per day for nonpregnant women. One drink equals 12 ounces of beer, 5 ounces of wine, or 1 ounces of hard liquor.  Do not use street drugs.  Do not share needles.  Ask your health care provider for help if you need support or information about quitting drugs.  Tell your health care provider if you often feel depressed.  Tell your health care provider if you have ever been abused or do not feel safe at home. This information is not intended to replace advice given to you by your health care provider. Make sure you discuss any questions you have with your health care provider. Document Released: 03/17/2011 Document Revised: 02/07/2016 Document Reviewed: 06/05/2015 Elsevier Interactive Patient Education  [employer].

## 2017-06-17 LAB — HSV(HERPES SIMPLEX VRS) I + II AB-IGG
HSV 1 GLYCOPROTEIN G AB, IGG: 19.6 {index} — AB (ref 0.00–0.90)
HSV 2 IGG, TYPE SPEC: 4.11 {index} — AB (ref 0.00–0.90)

## 2017-06-17 LAB — HSV-2 IGG SUPPLEMENTAL TEST: HSV-2 IGG SUPPLEMENTAL TEST: POSITIVE — AB

## 2017-06-17 LAB — HIV ANTIBODY (ROUTINE TESTING W REFLEX): HIV SCREEN 4TH GENERATION: NONREACTIVE

## 2017-06-19 LAB — CHLAMYDIA/GONOCOCCUS/TRICHOMONAS, NAA
CHLAMYDIA BY NAA: NEGATIVE
GONOCOCCUS BY NAA: NEGATIVE
Trich vag by NAA: NEGATIVE

## 2018-06-10 NOTE — Progress Notes (Deleted)
ANNUAL PREVENTATIVE CARE GYN  ENCOUNTER NOTE  Subjective:       Sherry Mack is a 32 y.o. G58P3003 Single African-American female, status post tubal ligation, here for a routine annual gynecologic exam.  Current complaints: 1.    Menstrual cycles are regular, pt endorses dysmenorrhea that is well controlled with ibuprofen/midol.  vaginal bleeding, discharge, dyspareunia, or dysuria.  Denies bowel or bladder changes.    Gynecologic History lmp- Contraception: tubal ligation Last Pap:05/2016- neg/neg -pos for trich. Results were: normal Last mammogram: na Intervals: Monthly Duration: 4 days Bleeding: Moderate with clots Dysmenorrhea: Mild, responding to Midol Dyspareunia: Negative Pap smear history: No abnormals   Obstetric History OB History  Gravida Para Term Preterm AB Living  3 3 3     3   SAB TAB Ectopic Multiple Live Births          3    # Outcome Date GA Lbr Len/2nd Weight Sex Delivery Anes PTL Lv  3 Term 2009   9 lb 6.4 oz (4.264 kg) M Vag-Spont   LIV  2 Term 2003   9 lb (4.082 kg) M Vag-Spont   LIV  1 Term 2001   8 lb 6.4 oz (3.81 kg) F Vag-Spont   LIV    Past Medical History:  Diagnosis Date  . STD (sexually transmitted disease)    20+ years ago- trich in pap 06/12/2016    Past Surgical History:  Procedure Laterality Date  . TUBAL LIGATION      No current outpatient medications on file prior to visit.   No current facility-administered medications on file prior to visit.     No Known Allergies  Social History   Socioeconomic History  . Marital status: Single    Spouse name: Not on file  . Number of children: Not on file  . Years of education: Not on file  . Highest education level: Not on file  Occupational History  . Not on file  Social Needs  . Financial resource strain: Not on file  . Food insecurity:    Worry: Not on file    Inability: Not on file  . Transportation needs:    Medical: Not on file    Non-medical: Not on file  Tobacco Use   . Smoking status: Never Smoker  . Smokeless tobacco: Never Used  Substance and Sexual Activity  . Alcohol use: Yes    Comment: social  . Drug use: No  . Sexual activity: Not Currently    Birth control/protection: Surgical  Lifestyle  . Physical activity:    Days per week: Not on file    Minutes per session: Not on file  . Stress: Not on file  Relationships  . Social connections:    Talks on phone: Not on file    Gets together: Not on file    Attends religious service: Not on file    Active member of club or organization: Not on file    Attends meetings of clubs or organizations: Not on file    Relationship status: Not on file  . Intimate partner violence:    Fear of current or ex partner: Not on file    Emotionally abused: Not on file    Physically abused: Not on file    Forced sexual activity: Not on file  Other Topics Concern  . Not on file  Social History Narrative  . Not on file    Family History  Problem Relation Age of Onset  .  Breast cancer Neg Hx   . Ovarian cancer Neg Hx   . Colon cancer Neg Hx   . Cancer Neg Hx   . Heart disease Neg Hx     The following portions of the patient's history were reviewed and updated as appropriate: allergies, current medications, past family history, past medical history, past social history, past surgical history and problem list.  Review of Systems   Objective:   There were no vitals taken for this visit. CONSTITUTIONAL: Well-developed, well-nourished female in no acute distress.  PSYCHIATRIC: Normal mood and affect. Normal behavior. Normal judgment and thought content. [place]: Alert and oriented to person, place, and time. Normal muscle tone coordination. No cranial nerve deficit noted. HENT:  Normocephalic, atraumatic, External right and left ear normal. Oropharynx is clear and moist  EYES: Conjunctivae and EOM are normal. Pupils are equal, round, and reactive to light. No scleral icterus.  NECK: Normal range of  motion, supple, no masses.  Normal thyroid.  SKIN: Skin is warm and dry. No rash noted. Not diaphoretic. No erythema. No pallor. CARDIOVASCULAR: Normal heart rate noted, regular rhythm, no murmur. RESPIRATORY: Clear to auscultation bilaterally. Effort and breath sounds normal, no problems with respiration noted. BREASTS: Symmetric in size. No masses, skin changes, nipple drainage, or lymphadenopathy. ABDOMEN: Soft, normal bowel sounds, no distention noted.  No tenderness, rebound or guarding.  BLADDER: Normal PELVIC:  External Genitalia: Normal  BUS: Normal  Vagina: Normal; no significant discharge  Cervix: Normal; parous, located low in the vaginal vault; no cervical motion tenderness  Uterus: Normal; midplane, top normal size, nontender; nonpalpable and nontender  Adnexa: Normal  RV: External Exam NormaI  MUSCULOSKELETAL: Normal range of motion. No tenderness.  No cyanosis, clubbing, or edema.  2+ DP/PT pulses.  LYMPHATIC: No Axillary, Supraclavicular, cervical or Inguinal Adenopathy.    Assessment:   Annual gynecologic examination 32 y.o. Contraception: tubal ligation bmi- 42 Dysmenorrhea Fatigue   Plan:  Pap: DUE 2020 Mammogram: Not Indicated Stool Guaiac Testing:  Not Indicated  Labs: STD labs, Lipid 1, FBS, TSH and Hemoglobin A1C  Routine preventative health maintenance measures emphasized: Exercise/Diet/Weight control, Tobacco Warnings, Alcohol/Substance use risks and Safe Sex Healthy eating with exercise greater than 45 minutes a day to help with controlled weight loss; goal is 1 pound per month Return to [place], CMA    I have seen, interviewed, and examined the patient in conjunction with the [employer].A. student and affirm the diagnosis and management plan. Martin A. DeFrancesco, MD, FACOG    Note: This dictation was prepared with Dragon dictation along with smaller phrase technology. Any transcriptional errors that result from  this process are unintentional.

## 2018-06-17 ENCOUNTER — Encounter: Payer: Commercial Managed Care - PPO | Admitting: Obstetrics and Gynecology

## 2018-11-11 LAB — HM HIV SCREENING LAB: HM HIV Screening: NEGATIVE

## 2019-05-20 ENCOUNTER — Other Ambulatory Visit: Payer: Self-pay

## 2019-05-20 ENCOUNTER — Ambulatory Visit: Payer: Self-pay

## 2019-05-20 ENCOUNTER — Ambulatory Visit: Payer: Self-pay | Admitting: Advanced Practice Midwife

## 2019-05-20 ENCOUNTER — Encounter: Payer: Self-pay | Admitting: Advanced Practice Midwife

## 2019-05-20 DIAGNOSIS — Z113 Encounter for screening for infections with a predominantly sexual mode of transmission: Secondary | ICD-10-CM

## 2019-05-20 LAB — WET PREP FOR TRICH, YEAST, CLUE
Trichomonas Exam: NEGATIVE
Yeast Exam: NEGATIVE

## 2019-05-20 MED ORDER — METRONIDAZOLE 500 MG PO TABS
500.0000 mg | ORAL_TABLET | Freq: Two times a day (BID) | ORAL | 0 refills | Status: AC
Start: 2019-05-20 — End: 2019-05-27

## 2019-05-20 NOTE — Progress Notes (Signed)
    STI clinic/screening visit  Subjective:  Sherry Mack is a 33 y.o. G32P3 nonsmoker female being seen today for an STI screening visit. The patient reports they do have symptoms.  Patient has the following medical conditions:   Patient Active Problem List   Diagnosis Date Noted  . Trichomonas vaginalis infection 06/24/2016  . Chronic fatigue 06/12/2016  . Obesity, Class III, BMI 40-49.9 (morbid obesity) ([place]) 06/12/2016  . Dysmenorrhea 06/12/2016  . Status post tubal ligation 06/12/2016  . Potential exposure to STD 06/12/2016  . Well woman exam with routine gynecological exam 06/12/2016     Chief Complaint  Patient presents with  . SEXUALLY TRANSMITTED DISEASE    HPI  Patient reports labial irritation with sl dysuria and white thick d/c since 05/16/19  See flowsheet for further details and programmatic requirements.    The following portions of the patient's history were reviewed and updated as appropriate: allergies, current medications, past medical history, past social history, past surgical history and problem list.  Objective:  There were no vitals filed for this visit.  Physical Exam Constitutional:      Appearance: Normal appearance.  HENT:     Head: Normocephalic.     Right Ear: External ear normal.     Left Ear: External ear normal.     Nose: Nose normal.     Mouth/Throat:     Mouth: Mucous membranes are moist.  Eyes:     Conjunctiva/sclera: Conjunctivae normal.  Neck:     Musculoskeletal: Neck supple.  Pulmonary:     Effort: Pulmonary effort is normal.  Abdominal:     Palpations: Abdomen is soft.     Tenderness: There is no abdominal tenderness.     Hernia: No hernia is present. There is no hernia in the left inguinal area or right inguinal area.  Genitourinary:    General: Normal vulva.     Exam position: Lithotomy position.     Pubic Area: No rash or pubic lice.      Labia:        Right: No rash or injury.        Left: No rash or injury.       Urethra: No urethral lesion.     Vagina: Vaginal discharge (thin white with malodor, ph,4.5) present. No lesions.     Cervix: No cervical motion tenderness.     Uterus: Normal.      Adnexa: Right adnexa normal and left adnexa normal.     Rectum: Normal.  Lymphadenopathy:     Lower Body: No right inguinal adenopathy. No left inguinal adenopathy.  Skin:    General: Skin is warm and dry.  Neurological:     Mental Status: She is alert.  Psychiatric:        Mood and Affect: Mood normal.        Behavior: Behavior normal.       Assessment and Plan:  Sherry Mack is a 33 y.o. female presenting to the [facility] Department for STI screening  1. Screening examination for venereal disease Treat wet mount per standing orders Immunization nurse consult - HIV [place] [facility] - Syphilis Serology, [place] [facility] - WET PREP FOR [place], YEAST, [place] [facility]     Return for PRN.  No future appointments.  Herbie Saxon, CNM

## 2019-05-20 NOTE — Progress Notes (Signed)
In due to vaginal irritation x 5 days; desires screening Debera Lat, RN

## 2019-10-13 ENCOUNTER — Other Ambulatory Visit: Payer: Self-pay

## 2019-10-13 ENCOUNTER — Encounter: Payer: Self-pay | Admitting: Physician Assistant

## 2019-10-13 ENCOUNTER — Ambulatory Visit: Payer: BC Managed Care – PPO | Admitting: Physician Assistant

## 2019-10-13 DIAGNOSIS — B9689 Other specified bacterial agents as the cause of diseases classified elsewhere: Secondary | ICD-10-CM

## 2019-10-13 DIAGNOSIS — Z113 Encounter for screening for infections with a predominantly sexual mode of transmission: Secondary | ICD-10-CM

## 2019-10-13 DIAGNOSIS — N760 Acute vaginitis: Secondary | ICD-10-CM

## 2019-10-13 LAB — WET PREP FOR TRICH, YEAST, CLUE
Trichomonas Exam: NEGATIVE
Yeast Exam: NEGATIVE

## 2019-10-13 MED ORDER — METRONIDAZOLE 500 MG PO TABS
500.0000 mg | ORAL_TABLET | Freq: Two times a day (BID) | ORAL | 0 refills | Status: AC
Start: 2019-10-13 — End: 2019-10-20

## 2019-10-13 NOTE — Progress Notes (Signed)
Wet mount reviewed, patient treated for BV per SO..Brigett Estell Brewer-Jensen, RN  

## 2019-10-13 NOTE — Progress Notes (Signed)
Patient here for STD testing. Jenetta Downer, RN

## 2019-10-13 NOTE — Progress Notes (Signed)
[facility] Department STI clinic/screening visit  Subjective:  Sherry Mack is a 34 y.o. female being seen today for an STI screening visit. The patient reports they do have symptoms.  Patient reports that they do not desire a pregnancy in the next year.   They reported they are not interested in discussing contraception today.  Patient's last menstrual period was 10/02/2019 (exact date).   Patient has the following medical conditions:   Patient Active Problem List   Diagnosis Date Noted  . Trichomonas vaginalis infection 06/24/2016  . Chronic fatigue 06/12/2016  . Obesity, Class III, BMI 40-49.9 (morbid obesity) ([place]) 06/12/2016  . Dysmenorrhea 06/12/2016  . Status post tubal ligation 06/12/2016  . Potential exposure to STD 06/12/2016  . Well woman exam with routine gynecological exam 06/12/2016    Chief Complaint  Patient presents with  . Exposure to STD    Woman presents for eval of 1 wk h/o increased clear vaginal discharge with perineal itching. No odor. LMP 10/02/19. S/P BTL. No new sexual partner or exposures.   Patient reports sx as listed above, no other concerns.  See flowsheet for further details and programmatic requirements.    The following portions of the patient's history were reviewed and updated as appropriate: allergies, current medications, past medical history, past social history, past surgical history and problem list.  Objective:  There were no vitals filed for this visit.  Physical Exam Constitutional:      General: She is not in acute distress.    Appearance: She is obese.  HENT:     Mouth/Throat:     Mouth: Mucous membranes are moist.     Pharynx: No oropharyngeal exudate or posterior oropharyngeal erythema.  Pulmonary:     Effort: Pulmonary effort is normal.  Abdominal:     Palpations: Abdomen is soft.     Tenderness: There is no abdominal tenderness. There is no guarding.  Genitourinary:    Pubic Area: No rash.    Labia:        Right: No rash or tenderness.        Left: No rash or tenderness.      Urethra: No urethral pain or urethral lesion.     Vagina: Vaginal discharge present. No erythema, tenderness or bleeding.     Cervix: No cervical motion tenderness, discharge, friability or erythema.     Uterus: Not tender.      Adnexa:        Right: No tenderness.         Left: No tenderness.       Comments: Inguinal folds with slight maceration/paleness/moist. Sm amt creamy vag discharge, vag pH <4.5. Skin:    General: Skin is warm and dry.     Findings: No rash.  Neurological:     Mental Status: She is alert and oriented to person, place, and time.  Psychiatric:        Mood and Affect: Mood normal.        Behavior: Behavior normal.        Judgment: Judgment normal.      Assessment and Plan:  Sherry Mack is a 34 y.o. female presenting to the [facility] Department for STI screening  1. Routine screening for STI (sexually transmitted infection) Treat per S.O. if indicated per wet prep. May use OTC antifungal cream on inguinal area for possible tinea cruris. Await GC/Chlam/HIV/RPR results.   Return in about 6 months (around 04/11/2020) for STI screening.  No  future appointments.  Lora Havens, PA-C

## 2019-10-25 ENCOUNTER — Telehealth: Payer: Self-pay | Admitting: Family Medicine

## 2019-10-25 NOTE — Telephone Encounter (Signed)
Wants to be seen today, still having issues from previous visit.

## 2019-10-26 NOTE — Telephone Encounter (Signed)
Pt states she did not take her medicine correctly from last visit, not sure if it a yeast infection. Is using yeast cream. Due to pt work schedule, pt scheduled for 10/31/2019, pt instructed she can not place any yeast cream in vagina for at least 3 days prior to appt.

## 2019-10-31 ENCOUNTER — Ambulatory Visit: Payer: Self-pay

## 2020-01-13 ENCOUNTER — Encounter: Payer: Self-pay | Admitting: Physician Assistant

## 2020-01-13 ENCOUNTER — Ambulatory Visit: Payer: Self-pay | Admitting: Physician Assistant

## 2020-01-13 ENCOUNTER — Other Ambulatory Visit: Payer: Self-pay

## 2020-01-13 DIAGNOSIS — N760 Acute vaginitis: Secondary | ICD-10-CM

## 2020-01-13 DIAGNOSIS — B356 Tinea cruris: Secondary | ICD-10-CM

## 2020-01-13 DIAGNOSIS — Z113 Encounter for screening for infections with a predominantly sexual mode of transmission: Secondary | ICD-10-CM

## 2020-01-13 DIAGNOSIS — B9689 Other specified bacterial agents as the cause of diseases classified elsewhere: Secondary | ICD-10-CM

## 2020-01-13 LAB — WET PREP FOR TRICH, YEAST, CLUE
Trichomonas Exam: NEGATIVE
Yeast Exam: NEGATIVE

## 2020-01-13 MED ORDER — MICONAZOLE NITRATE 2 % EX CREA: 1.0000 "application " | TOPICAL_CREAM | Freq: Two times a day (BID) | CUTANEOUS | 0 refills | Status: DC

## 2020-01-13 MED ORDER — METRONIDAZOLE 500 MG PO TABS: 500.0000 mg | ORAL_TABLET | Freq: Two times a day (BID) | ORAL | 0 refills | Status: DC

## 2020-01-13 MED ORDER — CLOTRIMAZOLE 1 % VA CREA
1.0000 | TOPICAL_CREAM | Freq: Every day | VAGINAL | 0 refills | Status: DC
Start: 2020-01-13 — End: 2020-01-20

## 2020-01-13 NOTE — Progress Notes (Signed)
[facility] Department STI clinic/screening visit  Subjective:  Sherry Mack is a 34 y.o. female being seen today for an STI screening visit. The patient reports they do have symptoms.  Patient reports that they do not desire a pregnancy in the next year.   They reported they are not interested in discussing contraception today.  No LMP recorded.   Patient has the following medical conditions:   Patient Active Problem List   Diagnosis Date Noted  . Trichomonas vaginalis infection 06/24/2016  . Chronic fatigue 06/12/2016  . Obesity, Class III, BMI 40-49.9 (morbid obesity) ([place]) 06/12/2016  . Dysmenorrhea 06/12/2016  . Status post tubal ligation 06/12/2016  . Potential exposure to STD 06/12/2016  . Well woman exam with routine gynecological exam 06/12/2016    Chief Complaint  Patient presents with  . SEXUALLY TRANSMITTED DISEASE    screening    HPI  Patient reports that she has had vaginal itching, mostly externally, and vaginal odor for 1 week.  Denies other symptoms, chronic conditions and regular medicines.  LMP 01/02/2020 and normal.  Has had BTL.  See flowsheet for further details and programmatic requirements.    The following portions of the patient's history were reviewed and updated as appropriate: allergies, current medications, past medical history, past social history, past surgical history and problem list.  Objective:  There were no vitals filed for this visit.  Physical Exam Constitutional:      General: She is not in acute distress.    Appearance: Normal appearance.  HENT:     Head: Normocephalic and atraumatic.     Comments: No nits, lice, or hair loss. No cervical, supraclavicular or axillary adenopathy.    Mouth/Throat:     Mouth: Mucous membranes are moist.     Pharynx: Oropharynx is clear. No oropharyngeal exudate or posterior oropharyngeal erythema.  Eyes:     Conjunctiva/sclera: Conjunctivae normal.  Pulmonary:     Effort:  Pulmonary effort is normal.  Abdominal:     Palpations: Abdomen is soft. There is no mass.     Tenderness: There is no abdominal tenderness. There is no guarding or rebound.  Genitourinary:    General: Normal vulva.     Rectum: Normal.     Comments: External genitalia/pubic area without nits, lice, edema, and inguinal adenopathy. Inguinal folds with mild erythema, moist and with slight grayish scaling as well as some hyperpigmentation present. Vagina with normal mucosa, moderate amount of thin, grayish discharge, pH=>4.5. Cervix without visible lesions. Uterus firm, mobile, nt, no masses, no CMT, no adnexal tenderness or fullness. Musculoskeletal:     Cervical back: Neck supple. No tenderness.  Skin:    General: Skin is warm and dry.     Findings: No bruising, erythema, lesion or rash.  Neurological:     Mental Status: She is alert and oriented to person, place, and time.  Psychiatric:        Mood and Affect: Mood normal.        Behavior: Behavior normal.        Thought Content: Thought content normal.        Judgment: Judgment normal.      Assessment and Plan:  YANAE Mack is a 34 y.o. female presenting to the [facility] Department for STI screening  1. Screening for STD (sexually transmitted disease) Patient into clinic with symptoms. Rec condoms with all sex. Await test results.  Counseled that RN will call if needs to RTC for further treatment  once results are back. - WET PREP FOR [place], YEAST, CLUE - Gonococcus culture - Chlamydia/Gonorrhea [place] [facility] - HIV [place] [facility] - Syphilis Serology, [place] [facility]  2. Tinea cruris Will treat with Miconazole 2% cream BID for tinea cruris and add vaginal treatment with Clotrimazole 1% vaginal cream to see if this will help resolve tinea cruris. Discussed with patient steps to prevent BV and yeast: Wear all-cotton underwear Sleep without underwear Take showers instead of baths Wear loose fitting clothing,  especially during warm/hot weather Use a hair dryer on low after bathing to dry the area Avoid scented soaps and body washes Do not douche May try over the counter probiotics or boric acid gel or suppositories Stop smoking - clotrimazole (CLOTRIMAZOLE-7) 1 % vaginal cream; Place 1 Applicatorful vaginally at bedtime for 7 days.  Dispense: 45 g; Refill: 0 - miconazole (MICONAZOLE ANTIFUNGAL) 2 % cream; Apply 1 application topically 2 (two) times daily for 14 days.  Dispense: 28.35 g; Refill: 0  3. BV (bacterial vaginosis) Will treat BV with Metronidazole 500mg  #14 1 po BID for 7 days with food, no EtOH for 24 hr before and until 72 hr after completing medicine. No sex for 7 days. - metroNIDAZOLE (FLAGYL) 500 MG tablet; Take 1 tablet (500 mg total) by mouth 2 (two) times daily for 7 days.  Dispense: 14 tablet; Refill: 0     No follow-ups on file.  Future Appointments  Date Time Provider [place]  01/20/2020 11:15 AM Versie Starks, Audie Clear, NP [place] None    Jerene Dilling, [place]

## 2020-01-14 NOTE — Progress Notes (Signed)
Chart reviewed by Pharmacist  Suzanne Walker PharmD, Contract Pharmacist at [facility]  

## 2020-01-17 ENCOUNTER — Ambulatory Visit: Payer: Self-pay | Admitting: Adult Health

## 2020-01-18 LAB — GONOCOCCUS CULTURE

## 2020-01-20 ENCOUNTER — Other Ambulatory Visit: Payer: Self-pay

## 2020-01-20 ENCOUNTER — Encounter: Payer: Self-pay | Admitting: Adult Health

## 2020-01-20 ENCOUNTER — Ambulatory Visit: Payer: BC Managed Care – PPO | Admitting: Adult Health

## 2020-01-20 VITALS — BP 117/59 | HR 76 | Temp 97.0°F | Resp 16 | Ht 68.0 in | Wt 264.0 lb

## 2020-01-20 DIAGNOSIS — Z7689 Persons encountering health services in other specified circumstances: Secondary | ICD-10-CM

## 2020-01-20 DIAGNOSIS — Z6841 Body Mass Index (BMI) 40.0 and over, adult: Secondary | ICD-10-CM | POA: Diagnosis not present

## 2020-01-20 NOTE — Progress Notes (Signed)
[facility] [place], [place] [address]  Internal MEDICINE  Office Visit Note  Patient Name: Sherry Mack  [id number]  [id number]  Date of Service: 02/12/2020   Complaints/HPI Pt is here for establishment of PCP. Chief Complaint  Patient presents with  . New Patient (Initial Visit)  . Weight Management    options for weight loss    HPI Pt is here to establish care.  She is a 34 yo AA female who is currently single.  She has 3 children ages 47, 62, 72.  She works two jobs currently at [employer] and [employer].  She has a family history significant for strokes and heart problems.  Her mother had a heart transplant 2 years ago.  She does not smoke or use illicit drugs.  She does drink alcohol at times.  She reports about once a month.  She is interested in losing weight to improve her health.  She currently does not exercise.  Her diet could use some improving, and she is aware.   Current Medication: Outpatient Encounter Medications as of 01/20/2020  Medication Sig  . [DISCONTINUED] clotrimazole (CLOTRIMAZOLE-7) 1 % vaginal cream Place 1 Applicatorful vaginally at bedtime for 7 days. (Patient not taking: Reported on 01/20/2020)  . [DISCONTINUED] metroNIDAZOLE (FLAGYL) 500 MG tablet Take 1 tablet (500 mg total) by mouth 2 (two) times daily for 7 days. (Patient not taking: Reported on 01/20/2020)  . [DISCONTINUED] miconazole (MICONAZOLE ANTIFUNGAL) 2 % cream Apply 1 application topically 2 (two) times daily for 14 days. (Patient not taking: Reported on 01/20/2020)   No facility-administered encounter medications on file as of 01/20/2020.    Surgical History: Past Surgical History:  Procedure Laterality Date  . TUBAL LIGATION      Medical History: Past Medical History:  Diagnosis Date  . STD (sexually transmitted disease)    20+ years ago- trich in pap 06/12/2016    Family History: Family History  Problem Relation Age of Onset  . Heart disease Mother   .  Stroke Maternal Grandmother   . Heart disease Maternal Grandmother   . Breast cancer Neg Hx   . Ovarian cancer Neg Hx   . Colon cancer Neg Hx   . Cancer Neg Hx     Social History   Socioeconomic History  . Marital status: Single    Spouse name: Not on file  . Number of children: Not on file  . Years of education: Not on file  . Highest education level: Not on file  Occupational History  . Not on file  Tobacco Use  . Smoking status: Never Smoker  . Smokeless tobacco: Never Used  Substance and Sexual Activity  . Alcohol use: Yes    Comment: social  . Drug use: No  . Sexual activity: Not Currently    Birth control/protection: Surgical  Other Topics Concern  . Not on file  Social History Narrative  . Not on file   Social Determinants of Health   Financial Resource Strain:   . Difficulty of Paying Living Expenses:   Food Insecurity:   . Worried About Charity fundraiser in the Last Year:   . Arboriculturist in the Last Year:   Transportation Needs:   . Film/video editor (Medical):   Marland Kitchen Lack of Transportation (Non-Medical):   Physical Activity:   . Days of Exercise per Week:   . Minutes of Exercise per Session:   Stress:   . Feeling  of Stress :   Social Connections:   . Frequency of Communication with Friends and Family:   . Frequency of Social Gatherings with Friends and Family:   . Attends Religious Services:   . Active Member of Clubs or Organizations:   . Attends Archivist Meetings:   Marland Kitchen Marital Status:   Intimate Partner Violence:   . Fear of Current or Ex-Partner:   . Emotionally Abused:   Marland Kitchen Physically Abused:   . Sexually Abused:      Review of Systems  Constitutional: Negative for chills, fatigue and unexpected weight change.  HENT: Negative for congestion, rhinorrhea, sneezing and sore throat.   Eyes: Negative for photophobia, pain and redness.  Respiratory: Negative for cough, chest tightness and shortness of breath.    Cardiovascular: Negative for chest pain and palpitations.  Gastrointestinal: Negative for abdominal pain, constipation, diarrhea, nausea and vomiting.  Endocrine: Negative.   Genitourinary: Negative for dysuria and frequency.  Musculoskeletal: Negative for arthralgias, back pain, joint swelling and neck pain.  Skin: Negative for rash.  Allergic/Immunologic: Negative.   Neurological: Negative for tremors and numbness.  Hematological: Negative for adenopathy. Does not bruise/bleed easily.  Psychiatric/Behavioral: Negative for behavioral problems and sleep disturbance. The patient is not nervous/anxious.     Vital Signs: BP (!) 117/59   Pulse 76   Temp (!) 97 F (36.1 C)   Resp 16   Ht 5\' 8"  (1.727 m)   Wt 264 lb (119.7 kg)   BMI 40.14 kg/m    Physical Exam Vitals and nursing note reviewed.  Constitutional:      General: She is not in acute distress.    Appearance: She is well-developed. She is not diaphoretic.  HENT:     Head: Normocephalic and atraumatic.     Mouth/Throat:     Pharynx: No oropharyngeal exudate.  Eyes:     Pupils: Pupils are equal, round, and reactive to light.  Neck:     Thyroid: No thyromegaly.     Vascular: No JVD.     Trachea: No tracheal deviation.  Cardiovascular:     Rate and Rhythm: Normal rate and regular rhythm.     Heart sounds: Normal heart sounds. No murmur. No friction rub. No gallop.   Pulmonary:     Effort: Pulmonary effort is normal. No respiratory distress.     Breath sounds: Normal breath sounds. No wheezing or rales.  Chest:     Chest wall: No tenderness.  Abdominal:     Palpations: Abdomen is soft.     Tenderness: There is no abdominal tenderness. There is no guarding.  Musculoskeletal:        General: Normal range of motion.     Cervical back: Normal range of motion and neck supple.  Lymphadenopathy:     Cervical: No cervical adenopathy.  Skin:    General: Skin is warm and dry.  Neurological:     Mental Status: She is  alert and oriented to person, place, and time.     Cranial Nerves: No cranial nerve deficit.  Psychiatric:        Behavior: Behavior normal.        Thought Content: Thought content normal.        Judgment: Judgment normal.    Assessment/Plan: 1. Encounter to establish care with new doctor Have labs done for baseline.  - CBC with Differential/Platelet - Lipid Panel With LDL/HDL Ratio - TSH - T4, free - Comprehensive metabolic panel  2. BMI 40.0-44.9,  adult [facility]) Pt will work on dietary changes, and add exercise into routine over the next few weeks.  If she has some results with this, we will discuss phentermine.  Obesity Counseling: Risk Assessment: An assessment of behavioral risk factors was made today and includes lack of exercise sedentary lifestyle, lack of portion control and poor dietary habits.  Risk Modification Advice: She was counseled on portion control guidelines. Restricting daily caloric intake to 1600. The detrimental long term effects of obesity on her health and ongoing poor compliance was also discussed with the patient.  3. Morbid obesity ([place]) bmi over 40  General Counseling: Macaela verbalizes understanding of the findings of todays visit and agrees with plan of treatment. I have discussed any further diagnostic evaluation that may be needed or ordered today. We also reviewed her medications today. she has been encouraged to call the office with any questions or concerns that should arise related to todays visit.  Orders Placed This Encounter  Procedures  . CBC with Differential/Platelet  . Lipid Panel With LDL/HDL Ratio  . TSH  . T4, free  . Comprehensive metabolic panel    No orders of the defined types were placed in this encounter.   Time spent: 30 Minutes   This patient was seen by Orson Gear AGNP-C in Collaboration with Dr Lavera Guise as a part of collaborative care agreement  Kendell Bane AGNP-C Internal Medicine

## 2020-02-14 ENCOUNTER — Telehealth: Payer: Self-pay

## 2020-02-14 NOTE — Telephone Encounter (Signed)
Confirmed appointment on 02/16/2020 and screened for covid. klh

## 2020-02-16 ENCOUNTER — Ambulatory Visit: Payer: BC Managed Care – PPO | Admitting: Adult Health

## 2020-02-21 ENCOUNTER — Telehealth: Payer: Self-pay

## 2020-02-21 LAB — TSH: TSH: 2.65 u[IU]/mL (ref 0.450–4.500)

## 2020-02-21 LAB — CBC WITH DIFFERENTIAL/PLATELET
Basophils Absolute: 0.1 10*3/uL (ref 0.0–0.2)
Basos: 1 %
EOS (ABSOLUTE): 0.5 10*3/uL — ABNORMAL HIGH (ref 0.0–0.4)
Eos: 5 %
Hematocrit: 36.5 % (ref 34.0–46.6)
Hemoglobin: 12.2 g/dL (ref 11.1–15.9)
Immature Grans (Abs): 0 10*3/uL (ref 0.0–0.1)
Immature Granulocytes: 0 %
Lymphocytes Absolute: 2.2 10*3/uL (ref 0.7–3.1)
Lymphs: 22 %
MCH: 26.9 pg (ref 26.6–33.0)
MCHC: 33.4 g/dL (ref 31.5–35.7)
MCV: 80 fL (ref 79–97)
Monocytes Absolute: 0.6 10*3/uL (ref 0.1–0.9)
Monocytes: 6 %
Neutrophils Absolute: 6.6 10*3/uL (ref 1.4–7.0)
Neutrophils: 66 %
Platelets: 303 10*3/uL (ref 150–450)
RBC: 4.54 x10E6/uL (ref 3.77–5.28)
RDW: 14.5 % (ref 11.7–15.4)
WBC: 9.9 10*3/uL (ref 3.4–10.8)

## 2020-02-21 LAB — COMPREHENSIVE METABOLIC PANEL
ALT: 14 IU/L (ref 0–32)
AST: 14 IU/L (ref 0–40)
Albumin/Globulin Ratio: 1.6 (ref 1.2–2.2)
Albumin: 3 g/dL — ABNORMAL LOW (ref 3.8–4.8)
Alkaline Phosphatase: 54 IU/L (ref 48–121)
BUN/Creatinine Ratio: 14 (ref 9–23)
BUN: 12 mg/dL (ref 6–20)
Bilirubin Total: <0.2 mg/dL (ref 0.0–1.2)
CO2: 20 mmol/L (ref 20–29)
Calcium: 8 mg/dL — ABNORMAL LOW (ref 8.7–10.2)
Chloride: 105 mmol/L (ref 96–106)
Creatinine, Ser: 0.85 mg/dL (ref 0.57–1.00)
GFR calc Af Amer: 103 mL/min/{1.73_m2} (ref >59)
GFR calc non Af Amer: 90 mL/min/{1.73_m2} (ref >59)
Globulin, Total: 1.9 g/dL (ref 1.5–4.5)
Glucose: 101 mg/dL — ABNORMAL HIGH (ref 65–99)
Potassium: 4.4 mmol/L (ref 3.5–5.2)
Sodium: 137 mmol/L (ref 134–144)
Total Protein: 4.9 g/dL — ABNORMAL LOW (ref 6.0–8.5)

## 2020-02-21 LAB — LIPID PANEL WITH LDL/HDL RATIO
Cholesterol, Total: 135 mg/dL (ref 100–199)
HDL: 28 mg/dL — ABNORMAL LOW (ref >39)
LDL Chol Calc (NIH): 80 mg/dL (ref 0–99)
LDL/HDL Ratio: 2.9 ratio (ref 0.0–3.2)
Triglycerides: 155 mg/dL — ABNORMAL HIGH (ref 0–149)
VLDL Cholesterol Cal: 27 mg/dL (ref 5–40)

## 2020-02-21 LAB — T4, FREE: Free T4: 1 ng/dL (ref 0.82–1.77)

## 2020-02-21 NOTE — Telephone Encounter (Signed)
Confirmed appointment on 02/23/2020 and screened for covid. klh

## 2020-02-23 ENCOUNTER — Other Ambulatory Visit: Payer: Self-pay

## 2020-02-23 ENCOUNTER — Encounter: Payer: Self-pay | Admitting: Adult Health

## 2020-02-23 ENCOUNTER — Ambulatory Visit: Payer: BC Managed Care – PPO | Admitting: Adult Health

## 2020-02-23 VITALS — BP 116/69 | HR 90 | Temp 97.4°F | Resp 16 | Ht 68.0 in | Wt 264.6 lb

## 2020-02-23 DIAGNOSIS — Z6841 Body Mass Index (BMI) 40.0 and over, adult: Secondary | ICD-10-CM

## 2020-02-23 DIAGNOSIS — T7840XA Allergy, unspecified, initial encounter: Secondary | ICD-10-CM

## 2020-02-23 MED ORDER — PHENTERMINE HCL 37.5 MG PO CAPS: 37.5000 mg | ORAL_CAPSULE | ORAL | 0 refills | Status: DC

## 2020-02-23 NOTE — Progress Notes (Signed)
[facility] [place], [place] [address]  Internal MEDICINE  Office Visit Note  Patient Name: Sherry Mack  [id number]  [id number]  Date of Service: 02/23/2020  Chief Complaint  Patient presents with  . Follow-up    Metabolic Test, review labs   . Allergies    poss allergic to insect bites, gets bad reaction     HPI  PT is here for follow up on weight loss.  She has been attempting to exercise some since our last visit. Her metabolic test shows a slow metabolism. She should eat approx 1500 calories daily, drink water and exercise.  She is still interested in trying the phentermine.  Her baseline labs were reviewed.  Her thyroid was normal.  She does report some allergic skin reactions recently to insect bites.  She takes benadryl and generally those symptoms will resolve.    Current Medication: No outpatient encounter medications on file as of 02/23/2020.   No facility-administered encounter medications on file as of 02/23/2020.    Surgical History: Past Surgical History:  Procedure Laterality Date  . TUBAL LIGATION      Medical History: Past Medical History:  Diagnosis Date  . STD (sexually transmitted disease)    20+ years ago- trich in pap 06/12/2016    Family History: Family History  Problem Relation Age of Onset  . Heart disease Mother   . Stroke Maternal Grandmother   . Heart disease Maternal Grandmother   . Breast cancer Neg Hx   . Ovarian cancer Neg Hx   . Colon cancer Neg Hx   . Cancer Neg Hx     Social History   Socioeconomic History  . Marital status: Single    Spouse name: Not on file  . Number of children: Not on file  . Years of education: Not on file  . Highest education level: Not on file  Occupational History  . Not on file  Tobacco Use  . Smoking status: Never Smoker  . Smokeless tobacco: Never Used  Substance and Sexual Activity  . Alcohol use: Yes    Comment: social  . Drug use: No  . Sexual activity:  Not Currently    Birth control/protection: Surgical  Other Topics Concern  . Not on file  Social History Narrative  . Not on file   Social Determinants of Health   Financial Resource Strain:   . Difficulty of Paying Living Expenses:   Food Insecurity:   . Worried About Charity fundraiser in the Last Year:   . Arboriculturist in the Last Year:   Transportation Needs:   . Film/video editor (Medical):   Marland Kitchen Lack of Transportation (Non-Medical):   Physical Activity:   . Days of Exercise per Week:   . Minutes of Exercise per Session:   Stress:   . Feeling of Stress :   Social Connections:   . Frequency of Communication with Friends and Family:   . Frequency of Social Gatherings with Friends and Family:   . Attends Religious Services:   . Active Member of Clubs or Organizations:   . Attends Archivist Meetings:   Marland Kitchen Marital Status:   Intimate Partner Violence:   . Fear of Current or Ex-Partner:   . Emotionally Abused:   Marland Kitchen Physically Abused:   . Sexually Abused:       Review of Systems  Constitutional: Negative for chills, fatigue and unexpected weight change.  HENT: Negative for congestion,  rhinorrhea, sneezing and sore throat.   Eyes: Negative for photophobia, pain and redness.  Respiratory: Negative for cough, chest tightness and shortness of breath.   Cardiovascular: Negative for chest pain and palpitations.  Gastrointestinal: Negative for abdominal pain, constipation, diarrhea, nausea and vomiting.  Endocrine: Negative.   Genitourinary: Negative for dysuria and frequency.  Musculoskeletal: Negative for arthralgias, back pain, joint swelling and neck pain.  Skin: Negative for rash.  Allergic/Immunologic: Negative.   Neurological: Negative for tremors and numbness.  Hematological: Negative for adenopathy. Does not bruise/bleed easily.  Psychiatric/Behavioral: Negative for behavioral problems and sleep disturbance. The patient is not nervous/anxious.      Vital Signs: BP 116/69   Pulse 90   Temp (!) 97.4 F (36.3 C)   Resp 16   Ht 5\' 8"  (1.727 m)   Wt 264 lb 9.6 oz (120 kg)   SpO2 99%   BMI 40.23 kg/m    Physical Exam Vitals and nursing note reviewed.  Constitutional:      General: She is not in acute distress.    Appearance: She is well-developed. She is not diaphoretic.  HENT:     Head: Normocephalic and atraumatic.     Mouth/Throat:     Pharynx: No oropharyngeal exudate.  Eyes:     Pupils: Pupils are equal, round, and reactive to light.  Neck:     Thyroid: No thyromegaly.     Vascular: No JVD.     Trachea: No tracheal deviation.  Cardiovascular:     Rate and Rhythm: Normal rate and regular rhythm.     Heart sounds: Normal heart sounds. No murmur heard.  No friction rub. No gallop.   Pulmonary:     Effort: Pulmonary effort is normal. No respiratory distress.     Breath sounds: Normal breath sounds. No wheezing or rales.  Chest:     Chest wall: No tenderness.  Abdominal:     Palpations: Abdomen is soft.     Tenderness: There is no abdominal tenderness. There is no guarding.  Musculoskeletal:        General: Normal range of motion.     Cervical back: Normal range of motion and neck supple.  Lymphadenopathy:     Cervical: No cervical adenopathy.  Skin:    General: Skin is warm and dry.  Neurological:     Mental Status: She is alert and oriented to person, place, and time.     Cranial Nerves: No cranial nerve deficit.  Psychiatric:        Behavior: Behavior normal.        Thought Content: Thought content normal.        Judgment: Judgment normal.     Assessment/Plan: 1. Allergy, initial encounter Continue otc ,meds as discussed.   2. BMI 40.0-44.9, adult (HCC) Obesity Counseling: Risk Assessment: An assessment of behavioral risk factors was made today and includes lack of exercise sedentary lifestyle, lack of portion control and poor dietary habits.  Risk Modification Advice: She was counseled on  portion control guidelines. Restricting daily caloric intake to 1450. The detrimental long term effects of obesity on her health and ongoing poor compliance was also discussed with the patient.  There is a liability release in patients' chart. There has been a 10 minute discussion about the side effects including but not limited to elevated blood pressure, anxiety, lack of sleep and dry mouth. Pt understands and will like to start/continue on appetite suppressant at this time. There will be one month RX given  at the time of visit with proper follow up. [place] diet plan with restricted calories is given to the pt. Pt understands and agrees with  plan of treatment  - Metabolic Test - phentermine 37.5 MG capsule; Take 1 capsule (37.5 mg total) by mouth every morning.  Dispense: 30 capsule; Refill: 0  3. Morbid obesity ([place]) BMI over 40    General Counseling: Camellia verbalizes understanding of the findings of todays visit and agrees with plan of treatment. I have discussed any further diagnostic evaluation that may be needed or ordered today. We also reviewed her medications today. she has been encouraged to call the office with any questions or concerns that should arise related to todays visit.    Orders Placed This Encounter  Procedures  . Metabolic Test    No orders of the defined types were placed in this encounter.   Time spent: 30 Minutes   This patient was seen by Orson Gear AGNP-C in Collaboration with Dr Lavera Guise as a part of collaborative care agreement     Kendell Bane AGNP-C Internal medicine

## 2020-03-22 ENCOUNTER — Ambulatory Visit: Payer: BC Managed Care – PPO | Admitting: Adult Health

## 2020-05-22 ENCOUNTER — Ambulatory Visit: Payer: BC Managed Care – PPO

## 2020-06-25 NOTE — Progress Notes (Unsigned)
Scanned Metabolic Test results for 02/23/20.

## 2020-07-18 ENCOUNTER — Ambulatory Visit: Payer: Self-pay | Admitting: Physician Assistant

## 2020-07-18 ENCOUNTER — Ambulatory Visit: Payer: BC Managed Care – PPO

## 2020-07-18 ENCOUNTER — Other Ambulatory Visit: Payer: Self-pay

## 2020-07-18 DIAGNOSIS — B373 Candidiasis of vulva and vagina: Secondary | ICD-10-CM

## 2020-07-18 DIAGNOSIS — B3731 Acute candidiasis of vulva and vagina: Secondary | ICD-10-CM

## 2020-07-18 DIAGNOSIS — Z113 Encounter for screening for infections with a predominantly sexual mode of transmission: Secondary | ICD-10-CM

## 2020-07-18 LAB — WET PREP FOR TRICH, YEAST, CLUE: Trichomonas Exam: NEGATIVE

## 2020-07-18 MED ORDER — CLOTRIMAZOLE 1 % VA CREA: 1.0000 | TOPICAL_CREAM | Freq: Every day | VAGINAL | 0 refills | Status: DC

## 2020-07-18 NOTE — Progress Notes (Signed)
Wet mount reviewed and pt treated for yeast per standing order and per Antoine Primas, PA order. Counseled pt per provider orders and pt states understanding. Provider orders completed.

## 2020-07-19 ENCOUNTER — Encounter: Payer: Self-pay | Admitting: Physician Assistant

## 2020-07-19 NOTE — Progress Notes (Signed)
[facility] Department STI clinic/screening visit  Subjective:  Sherry Mack is a 34 y.o. female being seen today for an STI screening visit. The patient reports they do have symptoms.  Patient reports that they do not desire a pregnancy in the next year.   They reported they are not interested in discussing contraception today.  Patient's last menstrual period was 07/06/2020.   Patient has the following medical conditions:   Patient Active Problem List   Diagnosis Date Noted  . Trichomonas vaginalis infection 06/24/2016  . Chronic fatigue 06/12/2016  . Obesity, Class III, BMI 40-49.9 (morbid obesity) ([place]) 06/12/2016  . Dysmenorrhea 06/12/2016  . Status post tubal ligation 06/12/2016  . Potential exposure to STD 06/12/2016  . Well woman exam with routine gynecological exam 06/12/2016    Chief Complaint  Patient presents with  . SEXUALLY TRANSMITTED DISEASE    screening    HPI  Patient reports that she has had irritation and itching in her vaginal area for 4 days.  Reports that she also recently changed soap.  Denies other symptoms and states last HIV test was in 12/2019.  Last pap was in 2017.  Has had BTL for BCM.   See flowsheet for further details and programmatic requirements.    The following portions of the patient's history were reviewed and updated as appropriate: allergies, current medications, past medical history, past social history, past surgical history and problem list.  Objective:  There were no vitals filed for this visit.  Physical Exam Constitutional:      General: She is not in acute distress.    Appearance: Normal appearance.  HENT:     Head: Normocephalic and atraumatic.     Comments: No nits,lice, or hair loss. No cervical, supraclavicular or axillary adenopathy.    Mouth/Throat:     Mouth: Mucous membranes are moist.     Pharynx: Oropharynx is clear. No oropharyngeal exudate or posterior oropharyngeal erythema.  Eyes:      Conjunctiva/sclera: Conjunctivae normal.  Pulmonary:     Effort: Pulmonary effort is normal.  Abdominal:     Palpations: Abdomen is soft. There is no mass.     Tenderness: There is no abdominal tenderness. There is no guarding or rebound.  Genitourinary:    General: Normal vulva.     Rectum: Normal.     Comments: External genitalia/pubic area without nits, lice, edema, erythema, lesions and inguinal adenopathy. Vagina with normal mucosa and moderate amount of white, clumping discharge. Cervix without visible lesions. Uterus firm, mobile, nt, no masses, no CMT, no adnexal tenderness or fullness. Musculoskeletal:     Cervical back: Neck supple. No tenderness.  Skin:    General: Skin is warm and dry.     Findings: No bruising, erythema, lesion or rash.  Neurological:     Mental Status: She is alert and oriented to person, place, and time.  Psychiatric:        Mood and Affect: Mood normal.        Behavior: Behavior normal.        Thought Content: Thought content normal.        Judgment: Judgment normal.      Assessment and Plan:  Sherry Mack is a 34 y.o. female presenting to the [facility] Department for STI screening  1. Screening for STD (sexually transmitted disease) Patient into clinic with symptoms. Rec condoms with all sex. Await test results.  Counseled that RN will call if needs to RTC for  treatment once results are back. - WET PREP FOR [place], YEAST, CLUE - Gonococcus culture - Chlamydia/Gonorrhea [facility] - HIV [facility] - Syphilis Serology, [facility]  2. Candidal vulvovaginitis Treat for yeast with Clotrimazole 1% vaginal cream 1 app qhs for 7 days. No sex for 10 days. - clotrimazole (CLOTRIMAZOLE-7) 1 % vaginal cream; Place 1 Applicatorful vaginally at bedtime.  Dispense: 45 g; Refill: 0     Return if symptoms worsen or fail to improve.  No future appointments.  Jerene Dilling, PA

## 2020-07-20 ENCOUNTER — Ambulatory Visit: Payer: BC Managed Care – PPO

## 2020-07-24 LAB — GONOCOCCUS CULTURE

## 2020-10-12 ENCOUNTER — Other Ambulatory Visit: Payer: BC Managed Care – PPO

## 2020-10-16 ENCOUNTER — Other Ambulatory Visit: Payer: BC Managed Care – PPO

## 2021-01-15 ENCOUNTER — Other Ambulatory Visit: Payer: BC Managed Care – PPO | Admitting: Hospice and Palliative Medicine

## 2021-02-08 ENCOUNTER — Telehealth: Payer: Self-pay

## 2021-02-08 ENCOUNTER — Other Ambulatory Visit: Payer: Self-pay | Admitting: Nurse Practitioner

## 2021-02-08 NOTE — Telephone Encounter (Signed)
Attempted to contact patient to have her arrive by 2:00 for her 02/13/21 appointment. No answer. Vm not set up-Toni

## 2021-02-13 ENCOUNTER — Other Ambulatory Visit: Payer: Self-pay | Admitting: Nurse Practitioner

## 2021-02-21 ENCOUNTER — Ambulatory Visit (INDEPENDENT_AMBULATORY_CARE_PROVIDER_SITE_OTHER): Payer: BC Managed Care – PPO | Admitting: Nurse Practitioner

## 2021-02-21 ENCOUNTER — Other Ambulatory Visit: Payer: Self-pay

## 2021-02-21 ENCOUNTER — Encounter: Payer: Self-pay | Admitting: Nurse Practitioner

## 2021-02-21 VITALS — BP 122/82 | HR 82 | Temp 98.6°F | Resp 16 | Ht 67.0 in | Wt 251.2 lb

## 2021-02-21 DIAGNOSIS — Z6839 Body mass index (BMI) 39.0-39.9, adult: Secondary | ICD-10-CM

## 2021-02-21 DIAGNOSIS — Z113 Encounter for screening for infections with a predominantly sexual mode of transmission: Secondary | ICD-10-CM

## 2021-02-21 DIAGNOSIS — Z0189 Encounter for other specified special examinations: Secondary | ICD-10-CM | POA: Diagnosis not present

## 2021-02-21 DIAGNOSIS — E559 Vitamin D deficiency, unspecified: Secondary | ICD-10-CM

## 2021-02-21 DIAGNOSIS — Z124 Encounter for screening for malignant neoplasm of cervix: Secondary | ICD-10-CM | POA: Diagnosis not present

## 2021-02-21 DIAGNOSIS — Z0001 Encounter for general adult medical examination with abnormal findings: Secondary | ICD-10-CM

## 2021-02-21 DIAGNOSIS — R300 Dysuria: Secondary | ICD-10-CM

## 2021-02-21 NOTE — Progress Notes (Signed)
[facility], [place] [address]  Internal MEDICINE  Office Visit Note  Patient Name: Sherry Mack  [id number]  [id number]  Date of Service: 02/27/2021  Chief Complaint  Patient presents with   Annual Exam    Pap and weight loss, patient thinks she has had an allergic reaction to a bug bites,    HPI Hildred presents for an annual well visit and physical exam.  Chaniah has no significant medical history except for chronic fatigue, morbid obesity with a BMI of 39.34, tubal ligation and a history of trichomonas infection.  She is due for her Pap with HPV testing today.  She is not of age for routine breast cancer screenings at this time but patient does report that she performs self breast exams at home.  She declines clinical breast exam today.  She has all age-appropriate immunizations except for COVID vaccine.  She declines COVID-vaccine when discussed.  She is interested in weight loss and would like to discuss that today as well.  She reports having an allergic reaction to bug bites.  She is due for her routine labs today.   Current Medication: Outpatient Encounter Medications as of 02/21/2021  Medication Sig   clotrimazole (CLOTRIMAZOLE-7) 1 % vaginal cream Place 1 Applicatorful vaginally at bedtime. (Patient not taking: Reported on 02/21/2021)   phentermine 37.5 MG capsule Take 1 capsule (37.5 mg total) by mouth every morning. (Patient not taking: Reported on 02/21/2021)   No facility-administered encounter medications on file as of 02/21/2021.    Surgical History: Past Surgical History:  Procedure Laterality Date   TUBAL LIGATION      Medical History: Past Medical History:  Diagnosis Date   STD (sexually transmitted disease)    20+ years ago- trich in pap 06/12/2016    Family History: Family History  Problem Relation Age of Onset   Heart disease Mother    Stroke Maternal Grandmother    Heart disease Maternal Grandmother    Breast cancer Neg  Hx    Ovarian cancer Neg Hx    Colon cancer Neg Hx    Cancer Neg Hx     Social History   Socioeconomic History   Marital status: Single    Spouse name: Not on file   Number of children: Not on file   Years of education: Not on file   Highest education level: Not on file  Occupational History   Not on file  Tobacco Use   Smoking status: Never   Smokeless tobacco: Never  Substance and Sexual Activity   Alcohol use: Yes    Comment: social   Drug use: No   Sexual activity: Yes    Birth control/protection: Surgical  Other Topics Concern   Not on file  Social History Narrative   Not on file   Social Determinants of Health   Financial Resource Strain: Not on file  Food Insecurity: Not on file  Transportation Needs: Not on file  Physical Activity: Not on file  Stress: Not on file  Social Connections: Not on file  Intimate Partner Violence: Not on file      Review of Systems  Constitutional:  Negative for activity change, appetite change, chills, fatigue, fever and unexpected weight change.  HENT: Negative.  Negative for congestion, ear pain, rhinorrhea, sore throat and trouble swallowing.   Eyes: Negative.   Respiratory: Negative.  Negative for cough, chest tightness, shortness of breath and wheezing.   Cardiovascular: Negative.  Negative for chest  pain.  Gastrointestinal: Negative.  Negative for abdominal pain, blood in stool, constipation, diarrhea, nausea and vomiting.  Endocrine: Negative.   Genitourinary: Negative.  Negative for difficulty urinating, dysuria, frequency, hematuria and urgency.  Musculoskeletal: Negative.  Negative for arthralgias, back pain, joint swelling, myalgias and neck pain.  Skin: Negative.  Negative for rash and wound.  Allergic/Immunologic: Negative.  Negative for immunocompromised state.  Neurological: Negative.  Negative for dizziness, seizures, numbness and headaches.  Hematological: Negative.   Psychiatric/Behavioral: Negative.   Negative for behavioral problems, self-injury and suicidal ideas. The patient is not nervous/anxious.    Vital Signs: BP 122/82   Pulse 82   Temp 98.6 F (37 C)   Resp 16   Ht _0  (1.702 m)   Wt 251 lb 3.2 oz (113.9 kg)   SpO2 98%   BMI 39.34 kg/m    Physical Exam Vitals reviewed.  Constitutional:      General: She is not in acute distress.    Appearance: Normal appearance. She is well-developed. She is obese. She is not ill-appearing or diaphoretic.  HENT:     Head: Normocephalic and atraumatic.     Right Ear: Tympanic membrane, ear canal and external ear normal.     Left Ear: Tympanic membrane, ear canal and external ear normal.     Nose: Nose normal.     Mouth/Throat:     Mouth: Mucous membranes are moist.     Pharynx: Oropharynx is clear. No oropharyngeal exudate.  Eyes:     Extraocular Movements: Extraocular movements intact.     Conjunctiva/sclera: Conjunctivae normal.     Pupils: Pupils are equal, round, and reactive to light.  Neck:     Thyroid: No thyromegaly.     Vascular: No JVD.     Trachea: No tracheal deviation.  Cardiovascular:     Rate and Rhythm: Normal rate and regular rhythm.     Pulses: Normal pulses.     Heart sounds: Normal heart sounds. No murmur heard.   No friction rub. No gallop.  Pulmonary:     Effort: Pulmonary effort is normal. No respiratory distress.     Breath sounds: Normal breath sounds. No wheezing or rales.  Chest:     Chest wall: No tenderness.  Abdominal:     General: Bowel sounds are normal. There is no distension.     Palpations: Abdomen is soft. There is no mass.     Tenderness: There is no abdominal tenderness. There is no guarding or rebound.     Hernia: No hernia is present.  Genitourinary:    General: Normal vulva.     Exam position: Lithotomy position.     Pubic Area: No rash or pubic lice.      Labia:        Right: No rash, tenderness, lesion or injury.        Left: No rash, tenderness, lesion or injury.       Urethra: No prolapse, urethral pain, urethral swelling or urethral lesion.     Vagina: Normal.     Cervix: Discharge present. No cervical motion tenderness, friability, lesion, erythema, cervical bleeding or eversion.     Uterus: Normal.      Adnexa: Right adnexa normal and left adnexa normal.     Rectum: Normal.  Musculoskeletal:        General: Normal range of motion.     Cervical back: Normal range of motion and neck supple.  Lymphadenopathy:     Cervical: No  cervical adenopathy.  Skin:    General: Skin is warm and dry.     Capillary Refill: Capillary refill takes less than 2 seconds.     Coloration: Skin is not jaundiced or pale.     Findings: No bruising, erythema, lesion or rash.  Neurological:     Mental Status: She is alert and oriented to person, place, and time.  Psychiatric:        Mood and Affect: Mood normal.        Behavior: Behavior normal.        Thought Content: Thought content normal.        Judgment: Judgment normal.       Assessment/Plan: 1. Encounter for routine adult health examination with abnormal findings Age-appropriate preventive screenings discussed, annual physical exam completed.    2. Encounter for routine laboratory testing Routine labs for health maintenance ordered  - CBC with Differential/Platelet - CMP14+EGFR - Lipid Panel With LDL/HDL Ratio - TSH + free T4  3. Body mass index (BMI) 39.0-39.9, adult Patient has an interest in losing weight.  Discussed the weight loss program at our facility.  Patient has tried phentermine in the past with minimal success.  Discussed Rybelsus with patient.  She is interested in trying this medication, a 1 month sample was provided to the patient in office today.  We will follow-up in 1 month to see how she is doing with weight loss.  4. Routine cervical smear Routine Pap done in office will call with results. - IGP, Aptima HPV  5. Screen for sexually transmitted diseases Routine swab for sexually  transmitted infections done in office, call with results - NuSwab Vaginitis Plus (VG+)  6. Dysuria Routine urinalysis done - UA/M w/rflx Culture, Routine - Microscopic Examination  7. Vitamin D deficiency Rule out vitamin D deficiency, lab ordered. - Vitamin D (25 hydroxy)      General Counseling: Kayann verbalizes understanding of the findings of todays visit and agrees with plan of treatment. I have discussed any further diagnostic evaluation that may be needed or ordered today. We also reviewed her medications today. she has been encouraged to call the office with any questions or concerns that should arise related to todays visit.    Orders Placed This Encounter  Procedures   Microscopic Examination   UA/M w/rflx Culture, Routine   NuSwab Vaginitis Plus (VG+)   CBC with Differential/Platelet   CMP14+EGFR   Lipid Panel With LDL/HDL Ratio   Vitamin D (25 hydroxy)   TSH + free T4    No orders of the defined types were placed in this encounter.   Return in about 4 weeks (around 03/21/2021) for F/U, Weight loss, Idell Hissong PCP.   Total time spent:30 Minutes Time spent includes review of chart, medications, test results, and follow up plan with the patient.   [place] Controlled Substance Database was reviewed by me.  This patient was seen by Jonetta Osgood, FNP-C in collaboration with Dr. Clayborn Bigness as a part of collaborative care agreement.  Caeli Linehan R. Valetta Fuller, MSN, FNP-C Internal medicine

## 2021-02-22 LAB — MICROSCOPIC EXAMINATION
Bacteria, UA: NONE SEEN
Casts: NONE SEEN /lpf
Epithelial Cells (non renal): >10 /hpf — AB (ref 0–10)
RBC, Urine: NONE SEEN /hpf (ref 0–2)
WBC, UA: NONE SEEN /hpf (ref 0–5)

## 2021-02-22 LAB — UA/M W/RFLX CULTURE, ROUTINE
Bilirubin, UA: NEGATIVE
Glucose, UA: NEGATIVE
Ketones, UA: NEGATIVE
Leukocytes,UA: NEGATIVE
Nitrite, UA: NEGATIVE
RBC, UA: NEGATIVE
Specific Gravity, UA: 1.023 (ref 1.005–1.030)
Urobilinogen, Ur: 0.2 mg/dL (ref 0.2–1.0)
pH, UA: 8 — ABNORMAL HIGH (ref 5.0–7.5)

## 2021-02-24 LAB — NUSWAB VAGINITIS PLUS (VG+)
Candida albicans, NAA: NEGATIVE
Candida glabrata, NAA: NEGATIVE
Chlamydia trachomatis, NAA: NEGATIVE
Neisseria gonorrhoeae, NAA: NEGATIVE
Trich vag by NAA: NEGATIVE

## 2021-02-26 ENCOUNTER — Telehealth: Payer: Self-pay

## 2021-02-26 LAB — IGP, APTIMA HPV: HPV Aptima: NEGATIVE

## 2021-02-26 NOTE — Telephone Encounter (Signed)
Called  patient to give her results but there was no answer and patient didn't have a voicemail set up. LNB

## 2021-02-26 NOTE — Progress Notes (Deleted)
HPV is negative, pap was negative for malignancy. Negative for STDs. Please call patient with results, thank you.

## 2021-03-21 ENCOUNTER — Ambulatory Visit: Payer: BC Managed Care – PPO | Admitting: Nurse Practitioner

## 2021-03-28 ENCOUNTER — Other Ambulatory Visit: Payer: Self-pay

## 2021-03-28 ENCOUNTER — Encounter: Payer: Self-pay | Admitting: Physician Assistant

## 2021-03-28 ENCOUNTER — Ambulatory Visit (INDEPENDENT_AMBULATORY_CARE_PROVIDER_SITE_OTHER): Payer: BC Managed Care – PPO | Admitting: Physician Assistant

## 2021-03-28 VITALS — BP 105/68 | HR 76 | Temp 98.0°F | Resp 16 | Ht 68.0 in | Wt 247.8 lb

## 2021-03-28 DIAGNOSIS — E669 Obesity, unspecified: Secondary | ICD-10-CM

## 2021-03-28 DIAGNOSIS — Z6839 Body mass index (BMI) 39.0-39.9, adult: Secondary | ICD-10-CM | POA: Diagnosis not present

## 2021-03-28 MED ORDER — TOPIRAMATE 25 MG PO TABS: 25.0000 mg | ORAL_TABLET | Freq: Every day | ORAL | 1 refills | Status: DC

## 2021-03-28 NOTE — Progress Notes (Signed)
[facility] [place], [place] [address]  Internal MEDICINE  Office Visit Note  Patient Name: Sherry Mack  [id number]  [id number]  Date of Service: 04/02/2021  Chief Complaint  Patient presents with   Follow-up    Diet pills, weight management    Weight Loss    HPI Pt is here for routine follow up -Did not have labs done and will do so now -Trying to eat less, and avoids eating after 8pm. Encouraged to increase exercise as well -Metabolic test last year recommended 1400cal caloric limit and given meal planning sheet in office today -Had done a sample of rybelsus, however does not have abnormal fasting glucose and therefore will not be able to get auth for prescription -Willing to try alternative  Current Medication: Outpatient Encounter Medications as of 03/28/2021  Medication Sig   topiramate (TOPAMAX) 25 MG tablet Take 1 tablet (25 mg total) by mouth daily.   clotrimazole (CLOTRIMAZOLE-7) 1 % vaginal cream Place 1 Applicatorful vaginally at bedtime. (Patient not taking: Reported on 03/28/2021)   phentermine 37.5 MG capsule Take 1 capsule (37.5 mg total) by mouth every morning. (Patient not taking: Reported on 03/28/2021)   No facility-administered encounter medications on file as of 03/28/2021.    Surgical History: Past Surgical History:  Procedure Laterality Date   TUBAL LIGATION      Medical History: Past Medical History:  Diagnosis Date   STD (sexually transmitted disease)    20+ years ago- trich in pap 06/12/2016    Family History: Family History  Problem Relation Age of Onset   Heart disease Mother    Stroke Maternal Grandmother    Heart disease Maternal Grandmother    Breast cancer Neg Hx    Ovarian cancer Neg Hx    Colon cancer Neg Hx    Cancer Neg Hx     Social History   Socioeconomic History   Marital status: Single    Spouse name: Not on file   Number of children: Not on file   Years of education: Not on file    Highest education level: Not on file  Occupational History   Not on file  Tobacco Use   Smoking status: Never   Smokeless tobacco: Never  Substance and Sexual Activity   Alcohol use: Yes    Comment: social   Drug use: No   Sexual activity: Yes    Birth control/protection: Surgical  Other Topics Concern   Not on file  Social History Narrative   Not on file   Social Determinants of Health   Financial Resource Strain: Not on file  Food Insecurity: Not on file  Transportation Needs: Not on file  Physical Activity: Not on file  Stress: Not on file  Social Connections: Not on file  Intimate Partner Violence: Not on file      Review of Systems  Constitutional:  Negative for chills, fatigue and unexpected weight change.  HENT:  Negative for congestion, postnasal drip, rhinorrhea, sneezing and sore throat.   Eyes:  Negative for redness.  Respiratory:  Negative for cough, chest tightness and shortness of breath.   Cardiovascular:  Negative for chest pain and palpitations.  Gastrointestinal:  Negative for abdominal pain, constipation, diarrhea, nausea and vomiting.  Genitourinary:  Negative for dysuria and frequency.  Musculoskeletal:  Negative for arthralgias, back pain, joint swelling and neck pain.  Skin:  Negative for rash.  Neurological: Negative.  Negative for tremors and numbness.  Hematological:  Negative for  adenopathy. Does not bruise/bleed easily.  Psychiatric/Behavioral:  Negative for behavioral problems (Depression), sleep disturbance and suicidal ideas. The patient is not nervous/anxious.    Vital Signs: BP 105/68   Pulse 76   Temp 98 F (36.7 C)   Resp 16   Ht 5\' 8"  (1.727 m)   Wt 247 lb 12.8 oz (112.4 kg)   SpO2 98%   BMI 37.68 kg/m    Physical Exam Vitals and nursing note reviewed.  Constitutional:      General: She is not in acute distress.    Appearance: She is well-developed. She is obese. She is not diaphoretic.  HENT:     Head: Normocephalic  and atraumatic.     Mouth/Throat:     Pharynx: No oropharyngeal exudate.  Eyes:     Pupils: Pupils are equal, round, and reactive to light.  Neck:     Thyroid: No thyromegaly.     Vascular: No JVD.     Trachea: No tracheal deviation.  Cardiovascular:     Rate and Rhythm: Normal rate and regular rhythm.     Heart sounds: Normal heart sounds. No murmur heard.   No friction rub. No gallop.  Pulmonary:     Effort: Pulmonary effort is normal. No respiratory distress.     Breath sounds: No wheezing or rales.  Chest:     Chest wall: No tenderness.  Abdominal:     General: Bowel sounds are normal.     Palpations: Abdomen is soft.  Musculoskeletal:        General: Normal range of motion.     Cervical back: Normal range of motion and neck supple.  Lymphadenopathy:     Cervical: No cervical adenopathy.  Skin:    General: Skin is warm and dry.  Neurological:     Mental Status: She is alert and oriented to person, place, and time.     Cranial Nerves: No cranial nerve deficit.  Psychiatric:        Behavior: Behavior normal.        Thought Content: Thought content normal.        Judgment: Judgment normal.       Assessment/Plan: 1. Obesity (BMI 35.0-39.9 without comorbidity) Will start on topiramate for wt loss and given calorie counting meal guidelines. Will also get labs to check on thyroid as well. Obesity Counseling: Risk Assessment: An assessment of behavioral risk factors was made today and includes lack of exercise sedentary lifestyle, lack of portion control and poor dietary habits.  Risk Modification Advice: She was counseled on portion control guidelines. Restricting daily caloric intake to 1400. The detrimental long term effects of obesity on her health and ongoing poor compliance was also discussed with the patient. - topiramate (TOPAMAX) 25 MG tablet; Take 1 tablet (25 mg total) by mouth daily.  Dispense: 30 tablet; Refill: 1   General Counseling: Olive verbalizes  understanding of the findings of todays visit and agrees with plan of treatment. I have discussed any further diagnostic evaluation that may be needed or ordered today. We also reviewed her medications today. she has been encouraged to call the office with any questions or concerns that should arise related to todays visit.    No orders of the defined types were placed in this encounter.   Meds ordered this encounter  Medications   topiramate (TOPAMAX) 25 MG tablet    Sig: Take 1 tablet (25 mg total) by mouth daily.    Dispense:  30 tablet  Refill:  1    This patient was seen by Drema Dallas, PA-C in collaboration with Dr. Clayborn Bigness as a part of collaborative care agreement.   Total time spent:30 Minutes Time spent includes review of chart, medications, test results, and follow up plan with the patient.      Dr Lavera Guise Internal medicine

## 2021-04-10 ENCOUNTER — Encounter: Payer: Self-pay | Admitting: Nurse Practitioner

## 2021-04-10 ENCOUNTER — Other Ambulatory Visit: Payer: Self-pay

## 2021-04-10 ENCOUNTER — Ambulatory Visit: Payer: BC Managed Care – PPO | Admitting: Internal Medicine

## 2021-04-10 VITALS — BP 116/76 | HR 92 | Temp 98.2°F | Resp 16 | Ht 68.0 in | Wt 248.4 lb

## 2021-04-10 DIAGNOSIS — H9203 Otalgia, bilateral: Secondary | ICD-10-CM | POA: Diagnosis not present

## 2021-04-10 DIAGNOSIS — R7309 Other abnormal glucose: Secondary | ICD-10-CM

## 2021-04-10 DIAGNOSIS — E669 Obesity, unspecified: Secondary | ICD-10-CM | POA: Diagnosis not present

## 2021-04-10 MED ORDER — RYBELSUS 7 MG PO TABS: 7.0000 mg | ORAL_TABLET | Freq: Every day | ORAL | 1 refills | Status: DC

## 2021-04-10 NOTE — Progress Notes (Signed)
[facility] [place], [place] [address]  Internal MEDICINE  Office Visit Note  Patient Name: Sherry Mack  [id number]  [id number]  Date of Service: 04/17/2021  Chief Complaint  Patient presents with   Acute Visit   Ear Pain   Otitis Media    Pain in left ear since Monday, right ear hurts a little    HPI Patient is here for acute visit she is complaining of left ear pain greater than right she is also has some nasal congestion does have allergies. Patient has been watching her diet and limiting her carbohydrates has elevated baseline blood sugar she is trying to lose weight however its not helping denies any chest pain or shortness of breath    Current Medication: Outpatient Encounter Medications as of 04/10/2021  Medication Sig   clotrimazole (CLOTRIMAZOLE-7) 1 % vaginal cream Place 1 Applicatorful vaginally at bedtime.   Semaglutide (RYBELSUS) 7 MG TABS Take 7 mg by mouth daily.   [DISCONTINUED] topiramate (TOPAMAX) 25 MG tablet Take 1 tablet (25 mg total) by mouth daily.   [DISCONTINUED] phentermine 37.5 MG capsule Take 1 capsule (37.5 mg total) by mouth every morning. (Patient not taking: Reported on 04/10/2021)   No facility-administered encounter medications on file as of 04/10/2021.    Surgical History: Past Surgical History:  Procedure Laterality Date   TUBAL LIGATION      Medical History: Past Medical History:  Diagnosis Date   STD (sexually transmitted disease)    20+ years ago- trich in pap 06/12/2016    Family History: Family History  Problem Relation Age of Onset   Heart disease Mother    Stroke Maternal Grandmother    Heart disease Maternal Grandmother    Breast cancer Neg Hx    Ovarian cancer Neg Hx    Colon cancer Neg Hx    Cancer Neg Hx     Social History   Socioeconomic History   Marital status: Single    Spouse name: Not on file   Number of children: Not on file   Years of education: Not on file   Highest  education level: Not on file  Occupational History   Not on file  Tobacco Use   Smoking status: Never   Smokeless tobacco: Never  Substance and Sexual Activity   Alcohol use: Yes    Comment: social   Drug use: No   Sexual activity: Yes    Birth control/protection: Surgical  Other Topics Concern   Not on file  Social History Narrative   Not on file   Social Determinants of Health   Financial Resource Strain: Not on file  Food Insecurity: Not on file  Transportation Needs: Not on file  Physical Activity: Not on file  Stress: Not on file  Social Connections: Not on file  Intimate Partner Violence: Not on file      Review of Systems  Constitutional:  Negative for fatigue and fever.  HENT:  Positive for ear pain. Negative for congestion, mouth sores and postnasal drip.   Respiratory:  Negative for cough and wheezing.   Cardiovascular:  Negative for chest pain.  Genitourinary:  Negative for flank pain.  Musculoskeletal:  Negative for joint swelling.  Psychiatric/Behavioral: Negative.     Vital Signs: BP 116/76   Pulse 92   Temp 98.2 F (36.8 C)   Resp 16   Ht '5\' 8"'$  (1.727 m)   Wt 248 lb 6.4 oz (112.7 kg)   SpO2 98%  BMI 37.77 kg/m    Physical Exam Constitutional:      Appearance: Normal appearance.     Comments:  poor alignment of her teeth is noted  HENT:     Head: Normocephalic and atraumatic.     Right Ear: Tympanic membrane and ear canal normal.     Left Ear: Tympanic membrane and ear canal normal.     Ears:     Comments: Closure of remote is not appropriate questionable TMJ    Nose: Nose normal.  Eyes:     Extraocular Movements: Extraocular movements intact.     Pupils: Pupils are equal, round, and reactive to light.  Cardiovascular:     Rate and Rhythm: Normal rate and regular rhythm.  Pulmonary:     Effort: Pulmonary effort is normal.     Breath sounds: Normal breath sounds.  Abdominal:     General: Abdomen is flat.  Musculoskeletal:         General: Normal range of motion.  Skin:    General: Skin is warm.  Neurological:     General: No focal deficit present.     Mental Status: She is alert and oriented to person, place, and time.       Assessment/Plan: 1. Abnormal glucose Patient is watching her carbohydrate intake we will start Rybelsus 3 mg once a day we will continue to watch her blood sugars at home  2. Obesity (BMI 35.0-39.9 without comorbidity) Obesity Counseling: Risk Assessment: An assessment of behavioral risk factors was made today and includes lack of exercise sedentary lifestyle, lack of portion control and poor dietary habits.  Risk Modification Advice: She was counseled on portion control guidelines. Restricting daily caloric intake to 1500. The detrimental long term effects of obesity on her health and ongoing poor compliance was also discussed with the patient.    3. Otalgia of both ears On examination There is no erythema in her tympanic membranes or external auditory canal, patient does have a proper closure of her bite and might need to see her dentist  General Counseling: Kelechi verbalizes understanding of the findings of todays visit and agrees with plan of treatment. I have discussed any further diagnostic evaluation that may be needed or ordered today. We also reviewed her medications today. she has been encouraged to call the office with any questions or concerns that should arise related to todays visit.    No orders of the defined types were placed in this encounter.   Meds ordered this encounter  Medications   Semaglutide (RYBELSUS) 7 MG TABS    Sig: Take 7 mg by mouth daily.    Dispense:  30 tablet    Refill:  1    Total time spent:35 Minutes Time spent includes review of chart, medications, test results, and follow up plan with the patient.   [place] Controlled Substance Database was reviewed by me.   Dr Lavera Guise Internal medicine

## 2021-04-19 ENCOUNTER — Other Ambulatory Visit: Payer: Self-pay | Admitting: Physician Assistant

## 2021-04-19 DIAGNOSIS — E669 Obesity, unspecified: Secondary | ICD-10-CM

## 2021-04-19 NOTE — Telephone Encounter (Signed)
Pt is no longer on this med 

## 2021-04-22 ENCOUNTER — Ambulatory Visit: Payer: BC Managed Care – PPO | Admitting: Physician Assistant

## 2021-05-09 ENCOUNTER — Ambulatory Visit: Payer: BC Managed Care – PPO | Admitting: Nurse Practitioner

## 2021-05-09 DIAGNOSIS — Z0289 Encounter for other administrative examinations: Secondary | ICD-10-CM

## 2021-05-10 ENCOUNTER — Encounter: Payer: Self-pay | Admitting: Emergency Medicine

## 2021-05-10 ENCOUNTER — Emergency Department
Admission: EM | Admit: 2021-05-10 | Discharge: 2021-05-10 | Disposition: A | Discharge status: Home / Self Care | Payer: BC Managed Care – PPO | Attending: Emergency Medicine | Admitting: Emergency Medicine

## 2021-05-10 ENCOUNTER — Ambulatory Visit: Payer: Self-pay | Admitting: Advanced Practice Midwife

## 2021-05-10 ENCOUNTER — Other Ambulatory Visit: Payer: Self-pay

## 2021-05-10 ENCOUNTER — Encounter: Payer: Self-pay | Admitting: Advanced Practice Midwife

## 2021-05-10 DIAGNOSIS — R102 Pelvic and perineal pain: Secondary | ICD-10-CM | POA: Insufficient documentation

## 2021-05-10 DIAGNOSIS — L298 Other pruritus: Secondary | ICD-10-CM | POA: Insufficient documentation

## 2021-05-10 DIAGNOSIS — Z113 Encounter for screening for infections with a predominantly sexual mode of transmission: Secondary | ICD-10-CM

## 2021-05-10 DIAGNOSIS — B379 Candidiasis, unspecified: Secondary | ICD-10-CM

## 2021-05-10 LAB — CHLAMYDIA/NGC RT PCR (ARMC ONLY)
Chlamydia Tr: NOT DETECTED
N gonorrhoeae: NOT DETECTED

## 2021-05-10 LAB — WET PREP, GENITAL
Clue Cells Wet Prep HPF POC: NONE SEEN
Sperm: NONE SEEN
Trich, Wet Prep: NONE SEEN
Yeast Wet Prep HPF POC: NONE SEEN

## 2021-05-10 LAB — WET PREP FOR TRICH, YEAST, CLUE: Trichomonas Exam: NEGATIVE

## 2021-05-10 MED ORDER — CLOTRIMAZOLE 1 % VA CREA: 1.0000 | TOPICAL_CREAM | Freq: Every day | VAGINAL | 0 refills | Status: AC

## 2021-05-10 NOTE — Progress Notes (Signed)
Pt here for STD screening.  Wet mount results reviewed and medication dispensed per SO.  Windle Guard, RN

## 2021-05-10 NOTE — Progress Notes (Signed)
[facility] Department STI clinic/screening visit  Subjective:  Sherry Mack is a 35 y.o. SBF exsmoker female G5P3 being seen today for an STI screening visit. The patient reports they do have symptoms.  Patient reports that they do not desire a pregnancy in the next year.   They reported they are not interested in discussing contraception today.  Patient's last menstrual period was 04/30/2021.   Patient has the following medical conditions:   Patient Active Problem List   Diagnosis Date Noted   Trichomonas vaginalis infection 06/24/2016   Chronic fatigue 06/12/2016   Obesity, Class III, BMI 40-49.9 (morbid obesity) ([place]) 06/12/2016   Dysmenorrhea 06/12/2016   Status post tubal ligation 2009 06/12/2016   Potential exposure to STD 06/12/2016    Chief Complaint  Patient presents with   SEXUALLY TRANSMITTED DISEASE    screening    HPI  Patient reports "irritation with my menstrual last week but burning, itching getting worse" since last week.  Last sex 02/09/21 with condom; with current partner x 3 mo. LMP 05/03/21. Last MJ age 64. Last cig age 28. Last ETOH 04/15/21 (3 Margaritas) on weekends. BTL 2009.  Last HIV test per patient/review of record was 07/18/20 Patient reports last pap was 02/21/21 neg HPV neg.   See flowsheet for further details and programmatic requirements.    The following portions of the patient's history were reviewed and updated as appropriate: allergies, current medications, past medical history, past social history, past surgical history and problem list.  Objective:  There were no vitals filed for this visit.  Physical Exam Vitals and nursing note reviewed.  Constitutional:      Appearance: Normal appearance. She is obese.  HENT:     Head: Normocephalic and atraumatic.     Mouth/Throat:     Mouth: Mucous membranes are moist.     Pharynx: Oropharynx is clear. No oropharyngeal exudate or posterior oropharyngeal erythema.  Eyes:      Conjunctiva/sclera: Conjunctivae normal.  Pulmonary:     Effort: Pulmonary effort is normal.  Abdominal:     Palpations: Abdomen is soft. There is no mass.     Tenderness: There is no abdominal tenderness. There is no rebound.     Comments: Soft without masses or tenderness, poor tone  Genitourinary:    General: Normal vulva.     Exam position: Lithotomy position.     Pubic Area: No rash or pubic lice.      Labia:        Right: No rash or lesion.        Left: No rash or lesion.      Vagina: Vaginal discharge (light pink leukorrhea,ph equivocal) present. No erythema, bleeding or lesions.     Cervix: Normal.     Uterus: Normal.      Adnexa: Right adnexa normal and left adnexa normal.     Rectum: Normal.  Lymphadenopathy:     Head:     Right side of head: No preauricular or posterior auricular adenopathy.     Left side of head: No preauricular or posterior auricular adenopathy.     Cervical: No cervical adenopathy.     Right cervical: No superficial, deep or posterior cervical adenopathy.    Left cervical: No superficial, deep or posterior cervical adenopathy.     Upper Body:     Right upper body: No supraclavicular or axillary adenopathy.     Left upper body: No supraclavicular or axillary adenopathy.     Lower  Body: No right inguinal adenopathy. No left inguinal adenopathy.  Skin:    General: Skin is warm and dry.     Findings: No rash.  Neurological:     Mental Status: She is alert and oriented to person, place, and time.     Assessment and Plan:  Sherry Mack is a 35 y.o. female presenting to the [facility] Department for STI screening  1. Screening examination for venereal disease Treat wet mount per standing orders Immunization nurse consult - WET PREP FOR [place], YEAST, CLUE - Syphilis Serology, [place] [facility] [place] [facility]     No follow-ups on file.  Future Appointments  Date Time Provider [place]  02/27/2022 11:00 AM McDonough, Si Gaul, PA-C [facility] None    Herbie Saxon, CNM

## 2021-05-10 NOTE — Discharge Instructions (Addendum)
Buy otc probiotics, align is a good brand although expensive, you could try a generic first Replenish to help with vaginal moisture Return if worsening I will call you about your std results

## 2021-05-10 NOTE — ED Provider Notes (Signed)
[facility] Emergency Department Provider Note  ____________________________________________   Event Date/Time   First MD Initiated Contact with Patient 05/10/21 [phone]     (approximate)  I have reviewed the triage vital signs and the nursing notes.   HISTORY  Chief Complaint Vaginal Itching    HPI Sherry Mack is a 35 y.o. female presents emergency department with vaginal itching and pain.  Symptoms started 1 day ago.  Patient states she has not had sex recently.  States usually when this starts it is bacterial vaginosis.  She denies any fever or chills.  No open sores.  Past Medical History:  Diagnosis Date   STD (sexually transmitted disease)    20+ years ago- trich in pap 06/12/2016    Patient Active Problem List   Diagnosis Date Noted   Trichomonas vaginalis infection 06/24/2016   Chronic fatigue 06/12/2016   Obesity, Class III, BMI 40-49.9 (morbid obesity) ([place]) 06/12/2016   Dysmenorrhea 06/12/2016   Status post tubal ligation 06/12/2016   Potential exposure to STD 06/12/2016   Well woman exam with routine gynecological exam 06/12/2016    Past Surgical History:  Procedure Laterality Date   TUBAL LIGATION      Prior to Admission medications   Medication Sig Start Date End Date Taking? Authorizing Provider  clotrimazole (CLOTRIMAZOLE-7) 1 % vaginal cream Place 1 Applicatorful vaginally at bedtime. Patient not taking: Reported on 05/10/2021 07/18/20   Jerene Dilling, PA  Semaglutide (RYBELSUS) 7 MG TABS Take 7 mg by mouth daily. Patient not taking: Reported on 05/10/2021 04/10/21   Lavera Guise, MD    Allergies Patient has no known allergies.  Family History  Problem Relation Age of Onset   Heart disease Mother    Stroke Maternal Grandmother    Heart disease Maternal Grandmother    Breast cancer Neg Hx    Ovarian cancer Neg Hx    Colon cancer Neg Hx    Cancer Neg Hx     Social History Social History   Tobacco Use    Smoking status: Never   Smokeless tobacco: Never  Substance Use Topics   Alcohol use: Yes    Comment: social   Drug use: No    Review of Systems  Constitutional: No fever/chills Eyes: No visual changes. ENT: No sore throat. Respiratory: Denies cough Cardiovascular: Denies chest pain Gastrointestinal: Denies abdominal pain Genitourinary: Negative for dysuria. Musculoskeletal: Negative for back pain. Skin: Negative for rash. Psychiatric: no mood changes,     ____________________________________________   PHYSICAL EXAM:  VITAL SIGNS: ED Triage Vitals  Enc Vitals Group     BP 05/10/21 0702 125/74     Pulse Rate 05/10/21 0702 83     Resp 05/10/21 0702 18     Temp 05/10/21 0702 98.9 F (37.2 C)     Temp Source 05/10/21 0702 Oral     SpO2 05/10/21 0702 99 %     Weight 05/10/21 0700 248 lb 7.3 oz (112.7 kg)     Height 05/10/21 0700 '5\' 8"'$  (1.727 m)     Head Circumference --      Peak Flow --      Pain Score --      Pain Loc --      Pain Edu? --      Excl. in [place]? --     Constitutional: Alert and oriented. Well appearing and in no acute distress. Eyes: Conjunctivae are normal.  Head: Atraumatic. Nose: No congestion/rhinnorhea. Mouth/Throat: Mucous membranes  are moist.   Neck:  supple no lymphadenopathy noted Cardiovascular: Normal rate, regular rhythm.  Respiratory: Normal respiratory effort.  No retractions, Abd: soft nontender bs normal all 4 quad GU: No external lesions, no vesicles, no copious discharge, internal exam shows scant discharge, no cervical motion tenderness Musculoskeletal: FROM all extremities, warm and well perfused Neurologic:  Normal speech and language.  Skin:  Skin is warm, dry and intact. No rash noted. Psychiatric: Mood and affect are normal. Speech and behavior are normal.  ____________________________________________   LABS (all labs ordered are listed, but only abnormal results are displayed)  Labs Reviewed  WET PREP, GENITAL -  Abnormal; Notable for the following components:      Result Value   WBC, Wet Prep HPF POC FEW (*)    All other components within normal limits  CHLAMYDIA/NGC RT PCR ([facility] ONLY)             ____________________________________________   ____________________________________________  RADIOLOGY    ____________________________________________   PROCEDURES  Procedure(s) performed: No  Procedures    ____________________________________________   INITIAL IMPRESSION / ASSESSMENT AND PLAN / ED COURSE  Pertinent labs & imaging results that were available during my care of the patient were reviewed by me and considered in my medical decision making (see chart for details).   Patient is a 35 year old female presents emergency department for vaginal itching.  See HPI.  Physical exam shows patient per stable.  Exam is consistent with either bacterial vaginosis, trichomoniasis, or yeast  Wet prep and GC/chlamydia ordered  Labs are reassuring.  Explained to the patient there is no sign of infection.  She was placed on a probiotic and told to use replenished.  She is not to douche or use any soaps in the vaginal area.  If she still continues to have symptoms in 3 to 4 days she should see her regular doctor or return emergency department.  She was discharged in stable condition.      Sherry Mack was evaluated in Emergency Department on 05/10/2021 for the symptoms described in the history of present illness. She was evaluated in the context of the global COVID-19 pandemic, which necessitated consideration that the patient might be at risk for infection with the SARS-CoV-2 virus that causes COVID-19. Institutional protocols and algorithms that pertain to the evaluation of patients at risk for COVID-19 are in a state of rapid change based on information released by regulatory bodies including the CDC and federal and state organizations. These policies and algorithms were followed during the  patient's care in the ED.    As part of my medical decision making, I reviewed the following data within the [place] notes reviewed and incorporated, Labs reviewed , Old chart reviewed, Notes from prior ED visits, and  Controlled Substance Database  ____________________________________________   FINAL CLINICAL IMPRESSION(S) / ED DIAGNOSES  Final diagnoses:  Vaginal pain      NEW MEDICATIONS STARTED DURING THIS VISIT:  Discharge Medication List as of 05/10/2021  8:18 AM       Note:  This document was prepared using Dragon voice recognition software and may include unintentional dictation errors.    Versie Starks, PA-C 05/10/21 [id number]    Lavonia Drafts, MD 05/10/21 [phone]

## 2021-05-10 NOTE — ED Triage Notes (Signed)
Pt comes into the ED for vaginal itching and pain.  Symptoms started 1 day ago.  Pt in NAD at this time and is ambulatory to triage.

## 2021-05-13 ENCOUNTER — Ambulatory Visit: Payer: Self-pay

## 2021-07-09 ENCOUNTER — Other Ambulatory Visit: Payer: Self-pay

## 2021-07-09 ENCOUNTER — Encounter: Payer: Self-pay | Admitting: Advanced Practice Midwife

## 2021-07-09 ENCOUNTER — Ambulatory Visit: Payer: Self-pay | Admitting: Advanced Practice Midwife

## 2021-07-09 DIAGNOSIS — Z113 Encounter for screening for infections with a predominantly sexual mode of transmission: Secondary | ICD-10-CM

## 2021-07-09 LAB — WET PREP FOR TRICH, YEAST, CLUE
Trichomonas Exam: NEGATIVE
Yeast Exam: NEGATIVE

## 2021-07-09 NOTE — Progress Notes (Signed)
[facility] Department STI clinic/screening visit  Subjective:  Sherry Mack is a 35 y.o. SBF exsmoker G5P3 female being seen today for an STI screening visit. The patient reports they do have symptoms.  Patient reports that they do not desire a pregnancy in the next year.   They reported they are not interested in discussing contraception today.  Patient's last menstrual period was 07/01/2021 (exact date).   Patient has the following medical conditions:   Patient Active Problem List   Diagnosis Date Noted   Trichomonas vaginalis infection 06/24/2016   Chronic fatigue 06/12/2016   Obesity, Class III, BMI 40-49.9 (morbid obesity) 248 lbs 06/12/2016   Dysmenorrhea 06/12/2016   Status post tubal ligation 2009 06/12/2016   Potential exposure to STD 06/12/2016    Chief Complaint  Patient presents with   [place]    screening    HPI  Patient reports "lips irritated and it comes and goes for 1 week now".  LMP 07/01/21. Last sex 07/06/21 without condom; with current partner x 4 mo; 2 partners in last 3 mo. Last MJ age 28. Last cig age 86. Last ETOH 07/06/21 (2 Margaritas) [place]. BTL 2009  Last HIV test per patient/review of record was 05/10/21 Patient reports last pap was 02/21/21 neg HPV neg  See flowsheet for further details and programmatic requirements.    The following portions of the patient's history were reviewed and updated as appropriate: allergies, current medications, past medical history, past social history, past surgical history and problem list.  Objective:  There were no vitals filed for this visit.  Physical Exam Vitals and nursing note reviewed.  Constitutional:      Appearance: Normal appearance. She is obese.  HENT:     Head: Normocephalic and atraumatic.     Mouth/Throat:     Mouth: Mucous membranes are moist.     Pharynx: Oropharynx is clear. Uvula midline. No oropharyngeal exudate or posterior oropharyngeal erythema.      Comments: Bilateral enlarged tonsils with sl white exudate on left Eyes:     Conjunctiva/sclera: Conjunctivae normal.  Pulmonary:     Effort: Pulmonary effort is normal.  Abdominal:     Palpations: Abdomen is soft. There is no mass.     Tenderness: There is no abdominal tenderness. There is no rebound.     Comments: Soft without masses or tenderness, increased adipose  Genitourinary:    General: Normal vulva.     Exam position: Lithotomy position.     Pubic Area: No rash or pubic lice.      Labia:        Right: No rash or lesion.        Left: No rash or lesion.      Vagina: Vaginal discharge (moderate white watery leukorrhea, ph<4.5) present. No erythema, bleeding or lesions.     Cervix: Normal.     Uterus: Normal.      Adnexa: Right adnexa normal and left adnexa normal.     Rectum: Normal.  Lymphadenopathy:     Head:     Right side of head: No preauricular or posterior auricular adenopathy.     Left side of head: No preauricular or posterior auricular adenopathy.     Cervical: No cervical adenopathy.     Right cervical: No superficial, deep or posterior cervical adenopathy.    Left cervical: No superficial, deep or posterior cervical adenopathy.     Upper Body:     Right upper body: No supraclavicular  or axillary adenopathy.     Left upper body: No supraclavicular or axillary adenopathy.     Lower Body: No right inguinal adenopathy. No left inguinal adenopathy.  Skin:    General: Skin is warm and dry.     Findings: No rash.  Neurological:     Mental Status: She is alert and oriented to person, place, and time.     Assessment and Plan:  Sherry Mack is a 35 y.o. female presenting to the [facility] Department for STI screening  1. Screening examination for venereal disease Treat wet mount per standing orders Immunization nurse consult - WET PREP FOR [place], YEAST, CLUE - Gonococcus culture - Chlamydia/Gonorrhea [place] [facility] - Gonococcus  culture     No follow-ups on file.  Future Appointments  Date Time Provider [place]  02/27/2022 11:00 AM McDonough, Si Gaul, PA-C [facility] None    Herbie Saxon, CNM

## 2021-07-09 NOTE — Progress Notes (Signed)
Pt here for STD screening.  Wet mount results reviewed, no treatment required.  Pt declined condoms. Windle Guard, RN

## 2021-07-13 LAB — GONOCOCCUS CULTURE

## 2021-09-30 ENCOUNTER — Telehealth: Payer: Self-pay

## 2021-09-30 NOTE — Telephone Encounter (Signed)
Pt called that she need med refill for weight loss advised need to seen at seen in 7/22

## 2021-10-04 ENCOUNTER — Other Ambulatory Visit: Payer: Self-pay

## 2021-10-04 ENCOUNTER — Ambulatory Visit (LOCAL_COMMUNITY_HEALTH_CENTER): Payer: Self-pay

## 2021-10-04 DIAGNOSIS — Z111 Encounter for screening for respiratory tuberculosis: Secondary | ICD-10-CM

## 2021-10-07 ENCOUNTER — Other Ambulatory Visit: Payer: Self-pay

## 2021-10-07 ENCOUNTER — Ambulatory Visit (LOCAL_COMMUNITY_HEALTH_CENTER): Payer: Self-pay | Admitting: Nurse Practitioner

## 2021-10-07 DIAGNOSIS — Z111 Encounter for screening for respiratory tuberculosis: Secondary | ICD-10-CM

## 2021-10-07 LAB — TB SKIN TEST
Induration: 0 mm
TB Skin Test: NEGATIVE

## 2021-10-18 ENCOUNTER — Encounter: Payer: Self-pay | Admitting: Physician Assistant

## 2021-10-18 ENCOUNTER — Ambulatory Visit (INDEPENDENT_AMBULATORY_CARE_PROVIDER_SITE_OTHER): Payer: BC Managed Care – PPO | Admitting: Physician Assistant

## 2021-10-18 ENCOUNTER — Other Ambulatory Visit: Payer: Self-pay

## 2021-10-18 DIAGNOSIS — E559 Vitamin D deficiency, unspecified: Secondary | ICD-10-CM

## 2021-10-18 DIAGNOSIS — E538 Deficiency of other specified B group vitamins: Secondary | ICD-10-CM | POA: Diagnosis not present

## 2021-10-18 DIAGNOSIS — R5383 Other fatigue: Secondary | ICD-10-CM

## 2021-10-18 DIAGNOSIS — R0602 Shortness of breath: Secondary | ICD-10-CM | POA: Diagnosis not present

## 2021-10-18 DIAGNOSIS — E669 Obesity, unspecified: Secondary | ICD-10-CM

## 2021-10-18 NOTE — Progress Notes (Signed)
[facility] [place], [place] [address]  Internal MEDICINE  Office Visit Note  Patient Name: Sherry Mack  [id number]  [id number]  Date of Service: 10/18/2021  Chief Complaint  Patient presents with   Follow-up   Shortness of Breath    Sometimes gets SOB when active - working out is not an issue, just occurs randomly    HPI Pt is here for routine follow up -Gets SOB walking up a hill, going to the gym and can walk on the treadmill and walk at normal pace for awhile fine, but then if she tries to run or increase intesnsity gets very SOB and cant continue.  -Has felt this way since covid in Dec 2021 -Denies any wheezing -Does not take any medications currently except a multivitamin -has lost 24lbs since last visit in July -she is considering cosmetic surgery for liposuction and may need clearance for this -Discussed obtaining labs and ordering PFT and echo due to worsening DOE -Did discuss some of this may be due to deconditioning and will take time to build up endurance while working on weight loss  Current Medication: Outpatient Encounter Medications as of 10/18/2021  Medication Sig   Multiple Vitamin (MULTIVITAMIN) tablet Take 1 tablet by mouth daily.   clotrimazole (CLOTRIMAZOLE-7) 1 % vaginal cream Place 1 Applicatorful vaginally at bedtime. (Patient not taking: Reported on 05/10/2021)   Semaglutide (RYBELSUS) 7 MG TABS Take 7 mg by mouth daily. (Patient not taking: Reported on 05/10/2021)   No facility-administered encounter medications on file as of 10/18/2021.    Surgical History: Past Surgical History:  Procedure Laterality Date   TUBAL LIGATION      Medical History: Past Medical History:  Diagnosis Date   STD (sexually transmitted disease)    20+ years ago- trich in pap 06/12/2016    Family History: Family History  Problem Relation Age of Onset   Heart disease Mother    Stroke Maternal Grandmother    Heart disease Maternal Grandmother     Breast cancer Neg Hx    Ovarian cancer Neg Hx    Colon cancer Neg Hx    Cancer Neg Hx     Social History   Socioeconomic History   Marital status: Single    Spouse name: Not on file   Number of children: Not on file   Years of education: Not on file   Highest education level: Not on file  Occupational History   Not on file  Tobacco Use   Smoking status: Former    Types: Cigarettes   Smokeless tobacco: Never  Substance and Sexual Activity   Alcohol use: Yes    Alcohol/week: 2.0 standard drinks    Types: 2 Standard drinks or equivalent per week    Comment: last use 07/06/21   Drug use: Not Currently    Types: Marijuana    Comment: last use age 55   Sexual activity: Yes    Partners: Male    Birth control/protection: Surgical  Other Topics Concern   Not on file  Social History Narrative   Not on file   Social Determinants of Health   Financial Resource Strain: Not on file  Food Insecurity: Not on file  Transportation Needs: Not on file  Physical Activity: Not on file  Stress: Not on file  Social Connections: Not on file  Intimate Partner Violence: Not on file      Review of Systems  Constitutional:  Positive for fatigue. Negative for chills  and unexpected weight change.  HENT:  Negative for congestion, postnasal drip, rhinorrhea, sneezing and sore throat.   Eyes:  Negative for redness.  Respiratory:  Positive for shortness of breath. Negative for cough and chest tightness.   Cardiovascular:  Negative for chest pain and palpitations.  Gastrointestinal:  Negative for abdominal pain, constipation, diarrhea, nausea and vomiting.  Genitourinary:  Negative for dysuria and frequency.  Musculoskeletal:  Negative for arthralgias, back pain, joint swelling and neck pain.  Skin:  Negative for rash.  Neurological: Negative.  Negative for tremors and numbness.  Hematological:  Negative for adenopathy. Does not bruise/bleed easily.  Psychiatric/Behavioral:  Negative for  behavioral problems (Depression), sleep disturbance and suicidal ideas. The patient is not nervous/anxious.    Vital Signs: BP 115/65    Pulse 95    Temp 98.1 F (36.7 C)    Resp 16    Ht 5\' 8"  (1.727 m)    Wt 224 lb 6.4 oz (101.8 kg)    SpO2 99%    BMI 34.12 kg/m    Physical Exam Vitals and nursing note reviewed.  Constitutional:      General: She is not in acute distress.    Appearance: She is well-developed. She is obese. She is not diaphoretic.  HENT:     Head: Normocephalic and atraumatic.     Mouth/Throat:     Pharynx: No oropharyngeal exudate.  Eyes:     Pupils: Pupils are equal, round, and reactive to light.  Neck:     Thyroid: No thyromegaly.     Vascular: No JVD.     Trachea: No tracheal deviation.  Cardiovascular:     Rate and Rhythm: Normal rate and regular rhythm.     Heart sounds: Normal heart sounds. No murmur heard.   No friction rub. No gallop.  Pulmonary:     Effort: Pulmonary effort is normal. No respiratory distress.     Breath sounds: No wheezing or rales.  Chest:     Chest wall: No tenderness.  Abdominal:     General: Bowel sounds are normal.     Palpations: Abdomen is soft.  Musculoskeletal:        General: Normal range of motion.     Cervical back: Normal range of motion and neck supple.  Lymphadenopathy:     Cervical: No cervical adenopathy.  Skin:    General: Skin is warm and dry.  Neurological:     Mental Status: She is alert and oriented to person, place, and time.     Cranial Nerves: No cranial nerve deficit.  Psychiatric:        Behavior: Behavior normal.        Thought Content: Thought content normal.        Judgment: Judgment normal.       Assessment/Plan: 1. SOB (shortness of breath) Will order echo and PFT for further evaluation. May be due to deconditioning, however will complete further workup - ECHOCARDIOGRAM COMPLETE; Future - Pulmonary Function Test; Future  2. Vitamin D deficiency - VITAMIN D 25 Hydroxy (Vit-D  Deficiency, Fractures)  3. B12 deficiency - B12 and Folate Panel  4. Other fatigue - CBC w/Diff/Platelet - Comprehensive metabolic panel - TSH + free T4 - Lipid Panel With LDL/HDL Ratio - Fe+TIBC+Fer  5. Obesity (BMI 30.0-34.9) Obesity Counseling: Had a lengthy discussion regarding patients BMI and weight issues. Patient was instructed on portion control as well as increased activity. Also discussed caloric restrictions with trying to maintain intake less than  2000 Kcal. Discussions were made in accordance with the 5As of weight management. Simple actions such as not eating late and if able to, taking a walk is suggested.    General Counseling: Jalaysia verbalizes understanding of the findings of todays visit and agrees with plan of treatment. I have discussed any further diagnostic evaluation that may be needed or ordered today. We also reviewed her medications today. she has been encouraged to call the office with any questions or concerns that should arise related to todays visit.    Orders Placed This Encounter  Procedures   CBC w/Diff/Platelet   Comprehensive metabolic panel   TSH + free T4   Lipid Panel With LDL/HDL Ratio   VITAMIN D 25 Hydroxy (Vit-D Deficiency, Fractures)   B12 and Folate Panel   Fe+TIBC+Fer   ECHOCARDIOGRAM COMPLETE   Pulmonary Function Test    No orders of the defined types were placed in this encounter.   This patient was seen by Drema Dallas, PA-C in collaboration with Dr. Clayborn Bigness as a part of collaborative care agreement.   Total time spent:30 Minutes Time spent includes review of chart, medications, test results, and follow up plan with the patient.      Dr Lavera Guise Internal medicine

## 2021-10-20 ENCOUNTER — Emergency Department
Admission: EM | Admit: 2021-10-20 | Discharge: 2021-10-20 | Disposition: A | Discharge status: Home / Self Care | Payer: BC Managed Care – PPO | Attending: Emergency Medicine | Admitting: Emergency Medicine

## 2021-10-20 ENCOUNTER — Emergency Department: Payer: BC Managed Care – PPO

## 2021-10-20 ENCOUNTER — Other Ambulatory Visit: Payer: Self-pay

## 2021-10-20 DIAGNOSIS — D259 Leiomyoma of uterus, unspecified: Secondary | ICD-10-CM | POA: Diagnosis not present

## 2021-10-20 DIAGNOSIS — R7989 Other specified abnormal findings of blood chemistry: Secondary | ICD-10-CM | POA: Diagnosis present

## 2021-10-20 DIAGNOSIS — N83201 Unspecified ovarian cyst, right side: Secondary | ICD-10-CM | POA: Insufficient documentation

## 2021-10-20 DIAGNOSIS — D649 Anemia, unspecified: Secondary | ICD-10-CM | POA: Diagnosis not present

## 2021-10-20 LAB — CBC WITH DIFFERENTIAL/PLATELET
Abs Immature Granulocytes: 0.04 10*3/uL (ref 0.00–0.07)
Basophils Absolute: 0 10*3/uL (ref 0.0–0.2)
Basophils Absolute: 0 10*3/uL (ref 0.0–0.1)
Basophils Relative: 1 %
Basos: 1 %
EOS (ABSOLUTE): 0.5 10*3/uL — ABNORMAL HIGH (ref 0.0–0.4)
Eos: 6 %
Eosinophils Absolute: 0.4 10*3/uL (ref 0.0–0.5)
Eosinophils Relative: 5 %
HCT: 21.5 % — ABNORMAL LOW (ref 36.0–46.0)
Hematocrit: 21 % — ABNORMAL LOW (ref 34.0–46.6)
Hemoglobin: 5.2 g/dL — CL (ref 11.1–15.9)
Hemoglobin: 5.3 g/dL — ABNORMAL LOW (ref 12.0–15.0)
Immature Grans (Abs): 0 10*3/uL (ref 0.0–0.1)
Immature Granulocytes: 1 %
Immature Granulocytes: 1 %
Lymphocytes Absolute: 1.8 10*3/uL (ref 0.7–3.1)
Lymphocytes Relative: 24 %
Lymphs Abs: 1.8 10*3/uL (ref 0.7–4.0)
Lymphs: 25 %
MCH: 14.2 pg — ABNORMAL LOW (ref 26.6–33.0)
MCH: 14.5 pg — ABNORMAL LOW (ref 26.0–34.0)
MCHC: 24.8 g/dL — CL (ref 31.5–35.7)
MCHC: 24.7 g/dL — ABNORMAL LOW (ref 30.0–36.0)
MCV: 58 fL — ABNORMAL LOW (ref 79–97)
MCV: 58.9 fL — ABNORMAL LOW (ref 80.0–100.0)
Monocytes Absolute: 0.8 10*3/uL (ref 0.1–0.9)
Monocytes Absolute: 0.6 10*3/uL (ref 0.1–1.0)
Monocytes Relative: 8 %
Monocytes: 11 %
Neutro Abs: 4.7 10*3/uL (ref 1.7–7.7)
Neutrophils Absolute: 4.2 10*3/uL (ref 1.4–7.0)
Neutrophils Relative %: 61 %
Neutrophils: 56 %
Platelets: 505 10*3/uL — ABNORMAL HIGH (ref 150–450)
Platelets: 383 10*3/uL (ref 150–400)
RBC: 3.65 x10E6/uL — ABNORMAL LOW (ref 3.77–5.28)
RBC: 3.65 MIL/uL — ABNORMAL LOW (ref 3.87–5.11)
RDW: 20.7 % — ABNORMAL HIGH (ref 11.7–15.4)
RDW: 21.2 % — ABNORMAL HIGH (ref 11.5–15.5)
WBC: 7.3 10*3/uL (ref 3.4–10.8)
WBC: 7.4 10*3/uL (ref 4.0–10.5)
nRBC: 0.3 % — ABNORMAL HIGH (ref 0.0–0.2)

## 2021-10-20 LAB — PREPARE RBC (CROSSMATCH)

## 2021-10-20 LAB — COMPREHENSIVE METABOLIC PANEL
ALT: 9 IU/L (ref 0–32)
AST: 10 IU/L (ref 0–40)
Albumin/Globulin Ratio: 1.6 (ref 1.2–2.2)
Albumin: 2.9 g/dL — ABNORMAL LOW (ref 3.8–4.8)
Alkaline Phosphatase: 36 IU/L — ABNORMAL LOW (ref 44–121)
BUN/Creatinine Ratio: 16 (ref 9–23)
BUN: 13 mg/dL (ref 6–20)
Bilirubin Total: 0.3 mg/dL (ref 0.0–1.2)
CO2: 21 mmol/L (ref 20–29)
Calcium: 7.9 mg/dL — ABNORMAL LOW (ref 8.7–10.2)
Chloride: 109 mmol/L — ABNORMAL HIGH (ref 96–106)
Creatinine, Ser: 0.82 mg/dL (ref 0.57–1.00)
Globulin, Total: 1.8 g/dL (ref 1.5–4.5)
Glucose: 103 mg/dL — ABNORMAL HIGH (ref 70–99)
Potassium: 4.3 mmol/L (ref 3.5–5.2)
Sodium: 140 mmol/L (ref 134–144)
Total Protein: 4.7 g/dL — ABNORMAL LOW (ref 6.0–8.5)
eGFR: 95 mL/min/{1.73_m2} (ref >59)

## 2021-10-20 LAB — BASIC METABOLIC PANEL
Anion gap: 3 — ABNORMAL LOW (ref 5–15)
BUN: 9 mg/dL (ref 6–20)
CO2: 23 mmol/L (ref 22–32)
Calcium: 8.2 mg/dL — ABNORMAL LOW (ref 8.9–10.3)
Chloride: 111 mmol/L (ref 98–111)
Creatinine, Ser: 0.9 mg/dL (ref 0.44–1.00)
GFR, Estimated: >60 mL/min (ref >60)
Glucose, Bld: 108 mg/dL — ABNORMAL HIGH (ref 70–99)
Potassium: 4.2 mmol/L (ref 3.5–5.1)
Sodium: 137 mmol/L (ref 135–145)

## 2021-10-20 LAB — LIPID PANEL WITH LDL/HDL RATIO
Cholesterol, Total: 139 mg/dL (ref 100–199)
HDL: 29 mg/dL — ABNORMAL LOW (ref >39)
LDL Chol Calc (NIH): 83 mg/dL (ref 0–99)
LDL/HDL Ratio: 2.9 ratio (ref 0.0–3.2)
Triglycerides: 150 mg/dL — ABNORMAL HIGH (ref 0–149)
VLDL Cholesterol Cal: 27 mg/dL (ref 5–40)

## 2021-10-20 LAB — PROTIME-INR
INR: 1 (ref 0.8–1.2)
Prothrombin Time: 13.1 seconds (ref 11.4–15.2)

## 2021-10-20 LAB — TSH+FREE T4
Free T4: 1.1 ng/dL (ref 0.82–1.77)
TSH: 1.89 u[IU]/mL (ref 0.450–4.500)

## 2021-10-20 LAB — APTT: aPTT: 26 seconds (ref 24–36)

## 2021-10-20 LAB — IRON,TIBC AND FERRITIN PANEL
Ferritin: 3 ng/mL — ABNORMAL LOW (ref 15–150)
Iron Saturation: 2 % — CL (ref 15–55)
Iron: 8 ug/dL — CL (ref 27–159)
Total Iron Binding Capacity: 351 ug/dL (ref 250–450)
UIBC: 343 ug/dL (ref 131–425)

## 2021-10-20 LAB — B12 AND FOLATE PANEL
Folate: >20 ng/mL (ref >3.0)
Vitamin B-12: 307 pg/mL (ref 232–1245)

## 2021-10-20 LAB — HCG, QUANTITATIVE, PREGNANCY: hCG, Beta Chain, Quant, S: <1 m[IU]/mL (ref <5)

## 2021-10-20 LAB — ABO/RH: ABO/RH(D): O POS

## 2021-10-20 LAB — VITAMIN D 25 HYDROXY (VIT D DEFICIENCY, FRACTURES): Vit D, 25-Hydroxy: 29.1 ng/mL — ABNORMAL LOW (ref 30.0–100.0)

## 2021-10-20 MED ORDER — SODIUM CHLORIDE 0.9 % IV SOLN
10.0000 mL/h | Freq: Once | INTRAVENOUS | Status: AC
  Administered 2021-10-20: 10 mL/h via INTRAVENOUS

## 2021-10-20 NOTE — ED Provider Notes (Signed)
[facility] Provider Note    Event Date/Time   First MD Initiated Contact with Patient 10/20/21 [phone]     (approximate)   History   Abnormal Lab   HPI  Sherry Mack is a 36 y.o. female history of previous tubal ligation prior pregnancy  Over 2 months patient has noticed increased shortness of breath.  Especially when she walks up or down hills.  She is also changed her diet and has been exercising regularly since the first of the year and has lost not quite 30 pounds.  She made appointment and reports she had to wait about 2 months to get into see her general practitioner because of the shortness of breath and she saw them on Friday.  She got a call telling her blood counts were very low and they wanted to see her on Monday  She has been reading about her low blood count, hemoglobin and feels like she need to come in sooner.  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  No black or bloody stools.  No vomiting.  Denies pregnancy.  Does have irregular menses now for several months but denies having any very heavy or prolonged vaginal bleeding.  Last menstrual cycle ended about 2 weeks ago no further bleeding  No chest pain.  No shortness of breath at rest but gets short of breath especially when she tries to walk up or down hills or stairs   Does not take any blood thinners  Family history includes mother who has had a previous heart transplant  Physical Exam   Triage Vital Signs: ED Triage Vitals  Enc Vitals Group     BP --      Pulse --      Resp --      Temp --      Temp src --      SpO2 --      Weight 10/20/21 0833 222 lb 10.6 oz (101 kg)     Height 10/20/21 0833 5\' 8"  (1.727 m)     Head Circumference --      Peak Flow --      Pain Score 10/20/21 0832 5     Pain Loc --       Pain Edu? --      Excl. in [place]? --     Most recent vital signs: Vitals:   10/20/21 1415 10/20/21 1420  BP:  110/69  Pulse: 96 (!) 102  Resp: 20 15  Temp:  97.6 F (36.4 C)  SpO2: 97% 100%     General: Awake, no distress.  Very pleasant. Moderate scleral icterus CV:  Good peripheral perfusion.  She does appear slightly pale in her complexion.  No murmur.  No tachycardia.  Heart tones are normal Resp:  Normal effort.  Clear lung sounds bilateral Abd:  No distention.  Abdomen soft nontender nondistended Other:  No lower extremity edema.  Moves all extremities well without distress  Alert very pleasant in no acute distress at all.  Very reassuring exam except for scleral icterus and mild pallor of the skin   ED Results / Procedures / Treatments   Labs (all labs ordered are listed, but only abnormal results are displayed) Labs Reviewed  CBC WITH DIFFERENTIAL/PLATELET - Abnormal; Notable for  the following components:      Result Value   RBC 3.65 (*)    Hemoglobin 5.3 (*)    HCT 21.5 (*)    MCV 58.9 (*)    MCH 14.5 (*)    MCHC 24.7 (*)    RDW 21.2 (*)    nRBC 0.3 (*)    All other components within normal limits  BASIC METABOLIC PANEL - Abnormal; Notable for the following components:   Glucose, Bld 108 (*)    Calcium 8.2 (*)    Anion gap 3 (*)    All other components within normal limits  PROTIME-INR  APTT  HCG, QUANTITATIVE, PREGNANCY  TYPE AND SCREEN  PREPARE RBC (CROSSMATCH)  ABO/RH     EKG  ED ECG REPORT I, Delman Kitten, the attending physician, personally viewed and interpreted this ECG.  Date: 10/20/2021 EKG Time: 845 Rate: 90 Rhythm: normal sinus rhythm QRS Axis: normal Intervals: normal ST/T Wave abnormalities: normal Narrative Interpretation: no evidence of acute ischemia    RADIOLOGY MPRESSION: 1. There is a mildly complicated cyst with a single thin septation in the right ovary measuring 2.8 cm of no significance. No follow-up necessary. 2. 1.9  cm fibroid in the uterus. 3. The uterus, endometrium, and ovaries are otherwise normal. No evidence of torsion. Electronically Signed   By: Dorise Bullion III M.D.   On: 10/20/2021 10:34   Reviewed the radiologist interpretations of the patient's transvaginal ultrasound as above.  Discussed with the patient.  She will follow-up with her PCP on these findings as well    PROCEDURES:  Critical Care performed: No  Procedures   MEDICATIONS ORDERED IN ED: Medications  0.9 %  sodium chloride infusion (10 mL/hr Intravenous New Bag/Given 10/20/21 0914)     IMPRESSION / MDM / ASSESSMENT AND PLAN / ED COURSE  I reviewed the triage vital signs and the nursing notes.                              Differential diagnosis includes, but is not limited to, symptomatic anemia.  I do not see evidence by clinical history or exam to suggest acute hemorrhage.  She denies any ongoing vaginal bleeding for over 2 weeks.  She does have irregular menstrual cycles, and we will check a transvaginal ultrasound to evaluate for uterine abnormality.  No diarrhea no black or bloody stools no vomiting.  Does not have a known history of anemia but her clinical history suggest that for 2 months now she is likely been symptomatic with's exertional dyspnea in particular.  Saw her doctor yesterday and her hemoglobin was less than 6.  Discussed risks benefits and alternatives with the patient and she consents to obtaining a blood transfusion for which we ordered 2 units.  Her platelet count is appropriate.  Coags ordered.  Have discussed her case and care with Dr. Tasia Catchings of hematology, she reviewed the patient's labs with me in indicates her findings are consistent with likely an iron deficient anemia but recommends the patient be referred to her for close outpatient follow-up, for which I have placed an urgent referral request.  The patient does not have any signs or symptoms that would make me suggest she is suffering from acute  DIC, hemorrhage, acute rheumatologic or blood dyscrasia.  Findings seem to be most suggestive of iron deficiency   I personally reviewed and interpreted the patient's labs which demonstrate significant anemia with hemoglobin of 5.3.  Normal platelets normal white count.  Metabolic panel unremarkable for acute finding.  Pregnancy test negative    Clinical Course as of 10/20/21 1532  Sun Oct 20, 2021  1044 Spoke with the patient's primary care provider, Ander Purpura.  She called me and notified me that she is in agreement with the plan for transfusion and will see the patient for her follow-up visit tomorrow [MQ]  1317 Patient is resting comfortably at this time.  She plans to follow-up tomorrow with Ander Purpura her physician assistant, and is aware that I have also recommended made referral to hematology.  Patient resting at this time is received 1 unit of blood and in no distress, no concerns. [MQ]  1317 Return precautions and treatment recommendations and follow-up discussed with the patient who is agreeable with the plan (in anticipation that she will likely be able to be discharged after completing second unit of blood).  [MQ]    Clinical Course User Index [MQ] Delman Kitten, MD   ----------------------------------------- 3:33 PM on 10/20/2021 ----------------------------------------- Patient has completed both units of blood transfusion.  Resting comfortably without distress.  No complaints no pain no discomfort no itching no hives or rashes.  Fully awake and alert.  Return precautions and treatment recommendations and follow-up discussed with the patient who is agreeable with the plan.  Patient will be following up tomorrow with her PCP  FINAL CLINICAL IMPRESSION(S) / ED DIAGNOSES   Final diagnoses:  Symptomatic anemia  Uterine leiomyoma, unspecified location     Rx / DC Orders   ED Discharge Orders          Ordered    Ambulatory referral to Hematology / Oncology        10/20/21 0905              Note:  This document was prepared using Dragon voice recognition software and may include unintentional dictation errors.   Delman Kitten, MD 10/20/21 1534

## 2021-10-20 NOTE — ED Triage Notes (Addendum)
Pt had some bloodwork done Friday and pt had low hgb. Pt states she has been feeling weak, tired, and has a headache. Denies bleeding from anywhere. Also states some shortness of breath with exertion that has been worse than usual for about 2 months. Has never had this issue before. Ambulatory to room.

## 2021-10-21 ENCOUNTER — Encounter: Payer: Self-pay | Admitting: Physician Assistant

## 2021-10-21 ENCOUNTER — Ambulatory Visit: Payer: BC Managed Care – PPO | Admitting: Physician Assistant

## 2021-10-21 DIAGNOSIS — D219 Benign neoplasm of connective and other soft tissue, unspecified: Secondary | ICD-10-CM

## 2021-10-21 DIAGNOSIS — E559 Vitamin D deficiency, unspecified: Secondary | ICD-10-CM

## 2021-10-21 DIAGNOSIS — N83209 Unspecified ovarian cyst, unspecified side: Secondary | ICD-10-CM

## 2021-10-21 DIAGNOSIS — N926 Irregular menstruation, unspecified: Secondary | ICD-10-CM | POA: Diagnosis not present

## 2021-10-21 DIAGNOSIS — E538 Deficiency of other specified B group vitamins: Secondary | ICD-10-CM | POA: Diagnosis not present

## 2021-10-21 DIAGNOSIS — D500 Iron deficiency anemia secondary to blood loss (chronic): Secondary | ICD-10-CM

## 2021-10-21 LAB — BPAM RBC
Blood Product Expiration Date: 202303082359
Blood Product Expiration Date: 202303082359
ISSUE DATE / TIME: 202302051059
ISSUE DATE / TIME: 202302051254
Unit Type and Rh: 5100
Unit Type and Rh: 5100

## 2021-10-21 LAB — TYPE AND SCREEN
ABO/RH(D): O POS
Antibody Screen: NEGATIVE
Unit division: 0
Unit division: 0

## 2021-10-21 MED ORDER — CYANOCOBALAMIN 1000 MCG/ML IJ SOLN
1000.0000 ug | Freq: Once | INTRAMUSCULAR | Status: AC
  Administered 2021-10-21: 1000 ug via INTRAMUSCULAR

## 2021-10-21 NOTE — Progress Notes (Signed)
[facility] [place], [place] [address]  Internal MEDICINE  Office Visit Note  Patient Name: Sherry Mack  [id number]  [id number]  Date of Service: 10/24/2021  Chief Complaint  Patient presents with   Follow-up    Follow up for blood results    HPI Pt is here for follow up to review labs and ED visit -Blood work had been ordered last week and resulted over the weekend with multiple abnormalities including critical hemoglobin of 5.2 and low iron sat at 2. Patient was called on Sat to discuss these findings and was asymptomatic therefore had planned to set up outpatient transfusion today, however patient read more about this and became concerned that she was having some symptoms on Sunday and went to the ED where she was given 2 units and was discharged with referral to heme/onc for iron infusions. This is set up for Wednesday. -She is feeling a little better since transfusion yesterday but still a little fatigued and SOB if exerting herself. -Since tubes tied in 2009 after 3rd kid has had irregular menstrual cycles--Heavy cycles with cramps. Lately has been late and lightening up some. When heavy goes through 5-6 tampons per day and wears a pad with it and sometimes can spot onto pad and sometimes has clots. A pelvic US was done in ED which did show a fibroid and ovarian cyst therefore will refer to OBGYN for further evaluation as this could be contributing to drop in hemoglobin -No dark or tarry stools, past few days has not been as regular. Discussed referral to GI as well to look for other potential sources of bleeding that could have contributed to drop in hemoglobin -Additional labs showed low calcium, protein, alk phos, Vit D, and B12. Will start calcium and vit D supplements. Will start b12 injections -eating a lot of salads, had cut back on red meat. Eating more poultry instead. Discussed making sure she gets enough nutrients and protein. -Will postpone  cosmetic procedure she had planned in the next 2 months as we need to focus on acute concerns and ensure she is stable before any elective procedures considered -Also based on findings that provide most likely cause of SOB with exertion will cancel echo and PFT previously ordered for now  Current Medication: Outpatient Encounter Medications as of 10/21/2021  Medication Sig   Multiple Vitamin (MULTIVITAMIN) tablet Take 1 tablet by mouth daily.   [DISCONTINUED] clotrimazole (CLOTRIMAZOLE-7) 1 % vaginal cream Place 1 Applicatorful vaginally at bedtime.   [DISCONTINUED] Semaglutide (RYBELSUS) 7 MG TABS Take 7 mg by mouth daily.   [EXPIRED] cyanocobalamin ((VITAMIN B-12)) injection 1,000 mcg    No facility-administered encounter medications on file as of 10/21/2021.    Surgical History: Past Surgical History:  Procedure Laterality Date   TUBAL LIGATION      Medical History: Past Medical History:  Diagnosis Date   Iron deficiency anemia due to chronic blood loss 10/23/2021   STD (sexually transmitted disease)    20+ years ago- trich in pap 06/12/2016    Family History: Family History  Problem Relation Age of Onset   Heart disease Mother    Stroke Maternal Grandmother    Heart disease Maternal Grandmother    Breast cancer Neg Hx    Ovarian cancer Neg Hx    Colon cancer Neg Hx    Cancer Neg Hx     Social History   Socioeconomic History   Marital status: Single    Spouse name: Not  on file   Number of children: Not on file   Years of education: Not on file   Highest education level: Not on file  Occupational History   Not on file  Tobacco Use   Smoking status: Never   Smokeless tobacco: Never  Vaping Use   Vaping Use: Never used  Substance and Sexual Activity   Alcohol use: Yes    Alcohol/week: 2.0 standard drinks    Types: 2 Standard drinks or equivalent per week    Comment: last use 07/06/21 (special occasions)   Drug use: Not Currently    Types: Marijuana    Comment:  last use age 24   Sexual activity: Yes    Partners: Male    Birth control/protection: Surgical  Other Topics Concern   Not on file  Social History Narrative   Not on file   Social Determinants of Health   Financial Resource Strain: Not on file  Food Insecurity: Not on file  Transportation Needs: Not on file  Physical Activity: Not on file  Stress: Not on file  Social Connections: Not on file  Intimate Partner Violence: Not on file      Review of Systems  Constitutional:  Positive for fatigue. Negative for chills and unexpected weight change.  HENT:  Negative for congestion, postnasal drip, rhinorrhea, sneezing and sore throat.   Eyes:  Negative for redness.  Respiratory:  Positive for shortness of breath. Negative for cough and chest tightness.   Cardiovascular:  Negative for chest pain and palpitations.  Gastrointestinal:  Negative for abdominal pain, constipation, diarrhea, nausea and vomiting.  Genitourinary:  Positive for menstrual problem. Negative for dysuria and frequency.  Musculoskeletal:  Negative for arthralgias, back pain, joint swelling and neck pain.  Skin:  Negative for rash.  Neurological: Negative.  Negative for tremors and numbness.  Hematological:  Negative for adenopathy. Does not bruise/bleed easily.  Psychiatric/Behavioral:  Negative for behavioral problems (Depression), sleep disturbance and suicidal ideas. The patient is not nervous/anxious.    Vital Signs: BP 117/65    Pulse 79    Temp 98.4 F (36.9 C)    Resp 16    Ht _0  (1.727 m)    Wt 241 lb (109.3 kg)    LMP 10/08/2021    SpO2 99%    BMI 36.64 kg/m    Physical Exam Vitals and nursing note reviewed.  Constitutional:      General: She is not in acute distress.    Appearance: She is well-developed. She is obese. She is not diaphoretic.  HENT:     Head: Normocephalic and atraumatic.     Mouth/Throat:     Pharynx: No oropharyngeal exudate.  Eyes:     Pupils: Pupils are equal, round, and  reactive to light.  Neck:     Thyroid: No thyromegaly.     Vascular: No JVD.     Trachea: No tracheal deviation.  Cardiovascular:     Rate and Rhythm: Normal rate and regular rhythm.     Heart sounds: Normal heart sounds. No murmur heard.   No friction rub. No gallop.  Pulmonary:     Effort: Pulmonary effort is normal. No respiratory distress.     Breath sounds: No wheezing or rales.  Chest:     Chest wall: No tenderness.  Abdominal:     General: Bowel sounds are normal.     Palpations: Abdomen is soft.  Musculoskeletal:        General: Normal range of motion.  Cervical back: Normal range of motion and neck supple.  Lymphadenopathy:     Cervical: No cervical adenopathy.  Skin:    General: Skin is warm and dry.  Neurological:     Mental Status: She is alert and oriented to person, place, and time.     Cranial Nerves: No cranial nerve deficit.  Psychiatric:        Behavior: Behavior normal.        Thought Content: Thought content normal.        Judgment: Judgment normal.       Assessment/Plan: 1. Iron deficiency anemia due to chronic blood loss Has appt with heme/onc this week. Will refer to OBGYN and GI for further evaluation of cause of severe IDA - Ambulatory referral to Obstetrics / Gynecology - Ambulatory referral to Gastroenterology  2. Fibroid - Ambulatory referral to Obstetrics / Gynecology  3. Cyst of ovary, unspecified laterality - Ambulatory referral to Obstetrics / Gynecology  4. Irregular menstrual cycle - Ambulatory referral to Obstetrics / Gynecology  5. B12 deficiency - cyanocobalamin ((VITAMIN B-12)) injection 1,000 mcg  6. Vitamin D deficiency Will start OTC supplement  7. Low calcium levels Will start OTC supplement   General Counseling: Adellyn verbalizes understanding of the findings of todays visit and agrees with plan of treatment. I have discussed any further diagnostic evaluation that may be needed or ordered today. We also  reviewed her medications today. she has been encouraged to call the office with any questions or concerns that should arise related to todays visit.    Orders Placed This Encounter  Procedures   Ambulatory referral to Obstetrics / Gynecology   Ambulatory referral to Gastroenterology    Meds ordered this encounter  Medications   cyanocobalamin ((VITAMIN B-12)) injection 1,000 mcg    This patient was seen by Drema Dallas, PA-C in collaboration with Dr. Clayborn Bigness as a part of collaborative care agreement.   Total time spent:35 Minutes Time spent includes review of chart, medications, test results, and follow up plan with the patient.      Dr Lavera Guise Internal medicine

## 2021-10-22 ENCOUNTER — Telehealth: Payer: Self-pay

## 2021-10-22 NOTE — Telephone Encounter (Signed)
Called patient no answer no way to leave a message 

## 2021-10-23 ENCOUNTER — Telehealth: Payer: Self-pay

## 2021-10-23 ENCOUNTER — Encounter: Payer: Self-pay | Admitting: Oncology

## 2021-10-23 ENCOUNTER — Inpatient Hospital Stay: Payer: BC Managed Care – PPO

## 2021-10-23 ENCOUNTER — Other Ambulatory Visit: Payer: Self-pay

## 2021-10-23 ENCOUNTER — Inpatient Hospital Stay: Payer: BC Managed Care – PPO | Attending: Oncology | Admitting: Oncology

## 2021-10-23 VITALS — BP 144/74 | HR 75 | Temp 96.7°F | Resp 18 | Wt 242.2 lb

## 2021-10-23 DIAGNOSIS — R634 Abnormal weight loss: Secondary | ICD-10-CM | POA: Insufficient documentation

## 2021-10-23 DIAGNOSIS — R5383 Other fatigue: Secondary | ICD-10-CM | POA: Diagnosis not present

## 2021-10-23 DIAGNOSIS — N921 Excessive and frequent menstruation with irregular cycle: Secondary | ICD-10-CM | POA: Diagnosis not present

## 2021-10-23 DIAGNOSIS — Z9851 Tubal ligation status: Secondary | ICD-10-CM | POA: Diagnosis not present

## 2021-10-23 DIAGNOSIS — D500 Iron deficiency anemia secondary to blood loss (chronic): Secondary | ICD-10-CM

## 2021-10-23 HISTORY — DX: Iron deficiency anemia secondary to blood loss (chronic): D50.0

## 2021-10-23 NOTE — Progress Notes (Signed)
Hematology/Oncology Consult note [phone] [phone]         Patient Care Team: [facility] [place] as PCP - General Lavera Guise, MD (Internal Medicine)  REFERRING PROVIDER: Delman Kitten, MD  CHIEF COMPLAINTS/REASON FOR VISIT:  Evaluation of iron deficiency anemia.   HISTORY OF PRESENTING ILLNESS:   Sherry Mack is a  36 y.o.  female with PMH listed below was seen in consultation at the request of  Delman Kitten, MD  for evaluation of iron deficiency anemia.   10/20/2021 Patient presented to [facility] ER for evaluation of increased SOB with exertion.  She had blood work done with PCP and was found to have severe anemia. She was sent to ER and received 2 units pf PRBC transfusion.  10/18/21 iron panel showed iron saturation of 22, ferritin 3.  10/20/21 CBC showed Hb was 5.3, mcv 58.9, platelet 383, wbc 7.4.  Heavy irregular menstrual period since her bilateral tubal ligation. She was previously on Depo.   + fatigue, SOB, craving ice chips.  + weight loss.   Review of Systems  Constitutional:  Positive for fatigue. Negative for appetite change, chills and fever.  HENT:   Negative for hearing loss and voice change.   Eyes:  Negative for eye problems.  Respiratory:  Positive for shortness of breath. Negative for chest tightness and cough.   Cardiovascular:  Negative for chest pain.  Gastrointestinal:  Negative for abdominal distention, abdominal pain and blood in stool.  Endocrine: Negative for hot flashes.  Genitourinary:  Positive for menstrual problem. Negative for difficulty urinating and frequency.   Musculoskeletal:  Negative for arthralgias.  Skin:  Negative for itching and rash.  Neurological:  Negative for extremity weakness.  Hematological:  Negative for adenopathy.  Psychiatric/Behavioral:  Negative for confusion.    MEDICAL HISTORY:  Past Medical History:  Diagnosis Date   STD (sexually transmitted disease)    20+ years ago- trich in pap  06/12/2016    SURGICAL HISTORY: Past Surgical History:  Procedure Laterality Date   TUBAL LIGATION      SOCIAL HISTORY: Social History   Socioeconomic History   Marital status: Single    Spouse name: Not on file   Number of children: Not on file   Years of education: Not on file   Highest education level: Not on file  Occupational History   Not on file  Tobacco Use   Smoking status: Never   Smokeless tobacco: Never  Vaping Use   Vaping Use: Never used  Substance and Sexual Activity   Alcohol use: Yes    Alcohol/week: 2.0 standard drinks    Types: 2 Standard drinks or equivalent per week    Comment: last use 07/06/21 (special occasions)   Drug use: Not Currently    Types: Marijuana    Comment: last use age 30   Sexual activity: Yes    Partners: Male    Birth control/protection: Surgical  Other Topics Concern   Not on file  Social History Narrative   Not on file   Social Determinants of Health   Financial Resource Strain: Not on file  Food Insecurity: Not on file  Transportation Needs: Not on file  Physical Activity: Not on file  Stress: Not on file  Social Connections: Not on file  Intimate Partner Violence: Not on file    FAMILY HISTORY: Family History  Problem Relation Age of Onset   Heart disease Mother    Stroke Maternal Grandmother    Heart  disease Maternal Grandmother    Breast cancer Neg Hx    Ovarian cancer Neg Hx    Colon cancer Neg Hx    Cancer Neg Hx     ALLERGIES:  has No Known Allergies.  MEDICATIONS:  Current Outpatient Medications  Medication Sig Dispense Refill   Multiple Vitamin (MULTIVITAMIN) tablet Take 1 tablet by mouth daily.     No current facility-administered medications for this visit.     PHYSICAL EXAMINATION: ECOG PERFORMANCE STATUS: 1 - Symptomatic but completely ambulatory Vitals:   10/23/21 1527  BP: (!) 144/74  Pulse: 75  Resp: 18  Temp: (!) 96.7 F (35.9 C)   Filed Weights   10/23/21 1527  Weight: 242  lb 3.2 oz (109.9 kg)    Physical Exam Constitutional:      General: She is not in acute distress.    Appearance: She is obese.  HENT:     Head: Normocephalic and atraumatic.  Eyes:     General: No scleral icterus. Cardiovascular:     Rate and Rhythm: Normal rate and regular rhythm.     Heart sounds: Normal heart sounds.  Pulmonary:     Effort: Pulmonary effort is normal. No respiratory distress.     Breath sounds: No wheezing.  Abdominal:     General: Bowel sounds are normal. There is no distension.     Palpations: Abdomen is soft.  Musculoskeletal:        General: No deformity. Normal range of motion.     Cervical back: Normal range of motion and neck supple.  Skin:    General: Skin is warm and dry.     Coloration: Skin is pale.     Findings: No erythema or rash.  Neurological:     Mental Status: She is alert and oriented to person, place, and time. Mental status is at baseline.     Cranial Nerves: No cranial nerve deficit.     Coordination: Coordination normal.  Psychiatric:        Mood and Affect: Mood normal.    LABORATORY DATA:  I have reviewed the data as listed Lab Results  Component Value Date   WBC 7.4 10/20/2021   HGB 5.3 (L) 10/20/2021   HCT 21.5 (L) 10/20/2021   MCV 58.9 (L) 10/20/2021   PLT 383 10/20/2021   Recent Labs    10/18/21 1049 10/20/21 0843  NA 140 137  K 4.3 4.2  CL 109* 111  CO2 21 23  GLUCOSE 103* 108*  BUN 13 9  CREATININE 0.82 0.90  CALCIUM 7.9* 8.2*  GFRNONAA  --  >60  PROT 4.7*  --   ALBUMIN 2.9*  --   AST 10  --   ALT 9  --   ALKPHOS 36*  --   BILITOT 0.3  --    Iron/TIBC/Ferritin/ %Sat    Component Value Date/Time   IRON 8 (LL) 10/18/2021 1049   TIBC 351 10/18/2021 1049   FERRITIN 3 (L) 10/18/2021 1049   IRONPCTSAT 2 (LL) 10/18/2021 1049      RADIOGRAPHIC STUDIES: I have personally reviewed the radiological images as listed and agreed with the findings in the report. US Transvaginal Non-OB  Result Date:  10/20/2021 CLINICAL DATA:  Anemia.  Irregular menses.  Tubal ligation. EXAM: TRANSABDOMINAL AND TRANSVAGINAL ULTRASOUND OF PELVIS DOPPLER ULTRASOUND OF OVARIES TECHNIQUE: Both transabdominal and transvaginal ultrasound examinations of the pelvis were performed. Transabdominal technique was performed for global imaging of the pelvis including uterus, ovaries, adnexal regions,  and pelvic cul-de-sac. It was necessary to proceed with endovaginal exam following the transabdominal exam to visualize the endometrium and ovaries. Color and duplex Doppler ultrasound was utilized to evaluate blood flow to the ovaries. COMPARISON:  None. FINDINGS: Uterus Measurements: 11.1 x 6.2 x 7.2 cm = volume: 257 mL. Contains a 1.9 cm hypoechoic region posteriorly in the fundus consistent with a fibroid. Endometrium Thickness: 6 mm.  No focal abnormality visualized. Right ovary Measurements: 3.9 x 2.7 x 3.0 cm = volume: 16.4 mL. Contains a small cyst with a single thin septation measuring 2.8 x 2.7 x 2.5 cm. Left ovary Measurements: 3.0 x 2.0 x 2.7 cm = volume: 8.4 mL. Normal appearance/no adnexal mass. Pulsed Doppler evaluation of both ovaries demonstrates normal low-resistance arterial and venous waveforms. Other findings No abnormal free fluid. IMPRESSION: 1. There is a mildly complicated cyst with a single thin septation in the right ovary measuring 2.8 cm of no significance. No follow-up necessary. 2. 1.9 cm fibroid in the uterus. 3. The uterus, endometrium, and ovaries are otherwise normal. No evidence of torsion. Electronically Signed   By: Dorise Bullion III M.D.   On: 10/20/2021 10:34   US Pelvis Complete  Result Date: 10/20/2021 CLINICAL DATA:  Anemia.  Irregular menses.  Tubal ligation. EXAM: TRANSABDOMINAL AND TRANSVAGINAL ULTRASOUND OF PELVIS DOPPLER ULTRASOUND OF OVARIES TECHNIQUE: Both transabdominal and transvaginal ultrasound examinations of the pelvis were performed. Transabdominal technique was performed for global  imaging of the pelvis including uterus, ovaries, adnexal regions, and pelvic cul-de-sac. It was necessary to proceed with endovaginal exam following the transabdominal exam to visualize the endometrium and ovaries. Color and duplex Doppler ultrasound was utilized to evaluate blood flow to the ovaries. COMPARISON:  None. FINDINGS: Uterus Measurements: 11.1 x 6.2 x 7.2 cm = volume: 257 mL. Contains a 1.9 cm hypoechoic region posteriorly in the fundus consistent with a fibroid. Endometrium Thickness: 6 mm.  No focal abnormality visualized. Right ovary Measurements: 3.9 x 2.7 x 3.0 cm = volume: 16.4 mL. Contains a small cyst with a single thin septation measuring 2.8 x 2.7 x 2.5 cm. Left ovary Measurements: 3.0 x 2.0 x 2.7 cm = volume: 8.4 mL. Normal appearance/no adnexal mass. Pulsed Doppler evaluation of both ovaries demonstrates normal low-resistance arterial and venous waveforms. Other findings No abnormal free fluid. IMPRESSION: 1. There is a mildly complicated cyst with a single thin septation in the right ovary measuring 2.8 cm of no significance. No follow-up necessary. 2. 1.9 cm fibroid in the uterus. 3. The uterus, endometrium, and ovaries are otherwise normal. No evidence of torsion. Electronically Signed   By: Dorise Bullion III M.D.   On: 10/20/2021 10:34   [place] Art/Ven Flow Abd Pelv Doppler  Result Date: 10/20/2021 CLINICAL DATA:  Anemia.  Irregular menses.  Tubal ligation. EXAM: TRANSABDOMINAL AND TRANSVAGINAL ULTRASOUND OF PELVIS DOPPLER ULTRASOUND OF OVARIES TECHNIQUE: Both transabdominal and transvaginal ultrasound examinations of the pelvis were performed. Transabdominal technique was performed for global imaging of the pelvis including uterus, ovaries, adnexal regions, and pelvic cul-de-sac. It was necessary to proceed with endovaginal exam following the transabdominal exam to visualize the endometrium and ovaries. Color and duplex Doppler ultrasound was utilized to evaluate blood flow to the  ovaries. COMPARISON:  None. FINDINGS: Uterus Measurements: 11.1 x 6.2 x 7.2 cm = volume: 257 mL. Contains a 1.9 cm hypoechoic region posteriorly in the fundus consistent with a fibroid. Endometrium Thickness: 6 mm.  No focal abnormality visualized. Right ovary Measurements: 3.9 x 2.7 x  3.0 cm = volume: 16.4 mL. Contains a small cyst with a single thin septation measuring 2.8 x 2.7 x 2.5 cm. Left ovary Measurements: 3.0 x 2.0 x 2.7 cm = volume: 8.4 mL. Normal appearance/no adnexal mass. Pulsed Doppler evaluation of both ovaries demonstrates normal low-resistance arterial and venous waveforms. Other findings No abnormal free fluid. IMPRESSION: 1. There is a mildly complicated cyst with a single thin septation in the right ovary measuring 2.8 cm of no significance. No follow-up necessary. 2. 1.9 cm fibroid in the uterus. 3. The uterus, endometrium, and ovaries are otherwise normal. No evidence of torsion. Electronically Signed   By: Dorise Bullion III M.D.   On: 10/20/2021 10:34      ASSESSMENT & PLAN:  1. Iron deficiency anemia due to chronic blood loss   2. Menorrhagia with irregular cycle    # Iron deficiency anemia Labs are reviewed and discussed with patient. Discussed with patient about IV venofer treatments.  Plan IV iron with Venofer 200mg  weekly x 4 doses. Risks of infusion reactions including anaphylactic reactions were discussed with patient. Other side effects include but not limited to high blood pressure, headache,wheezing, SOB, skin rash and itchiness, weight gain, leg swelling, etc. Patient voices understanding and is willing to proceed.   # Menorrhagia, irregular, recommend patient to get gyn evaluation.   Orders Placed This Encounter  Procedures   CBC with Differential/Platelet    Standing Status:   Future    Standing Expiration Date:   10/23/2022   Ferritin    Standing Status:   Future    Standing Expiration Date:   10/23/2022   Iron and TIBC    Standing Status:   Future     Standing Expiration Date:   10/23/2022    All questions were answered. The patient knows to call the clinic with any problems questions or concerns.   Delman Kitten, MD    Return of visit: Labs in 3 moths (cbc,iron,ferr)  MD/ venofer 1-2 days after labs  Thank you for this kind referral and the opportunity to participate in the care of this patient. A copy of today's note is routed to referring provider   Earlie Server, MD, PhD [facility] Hematology Oncology 10/23/2021

## 2021-10-23 NOTE — Progress Notes (Signed)
Patient here to establish care for anemia. She would like to discuss what may be causing this.

## 2021-10-23 NOTE — Telephone Encounter (Signed)
CALLED PATIENT NO ANSWER LEFT VOICEMAIL FOR A CALL BACK °Letter sent °

## 2021-10-24 NOTE — Patient Instructions (Signed)
Iron Deficiency Anemia, Adult Iron deficiency anemia is when you do not have enough red blood cells or hemoglobin in your blood. This happens because you have too little iron in your body. Hemoglobin carries oxygen to parts of the body. Anemia can cause yourbody to not get enough oxygen. What are the causes? Not eating enough foods that have iron in them. The body not being able to take in iron well. Needing more iron due to pregnancy or heavy menstrual periods, for females. Cancer. Bleeding in the bowels. Many blood draws. What increases the risk? Being pregnant. Being a teenage girl going through a growth spurt. What are the signs or symptoms? Pale skin, lips, and nails. Weakness, dizziness, and getting tired easily. Headache. Feeling like you cannot breathe well when moving (shortness of breath). Cold hands and feet. Fast heartbeat or a heartbeat that is not regular. Feeling grouchy (irritable) or breathing fast. These are more common in very bad anemia. Mild anemia may not cause any symptoms. How is this treated? This condition is treated by finding out why you do not have enough iron and then getting more iron. It may include: Adding foods to your diet that have a lot of iron. Taking iron pills (supplements). If you are pregnant or breastfeeding, you may need to take extra iron. Your diet often does not provide the amount of iron that you need. Getting more vitamin C in your diet. Vitamin C helps your body take in iron. You may need to take iron pills with a glass of orange juice or vitamin C pills. Medicines to make heavy menstrual periods lighter. Surgery. You may need blood tests to see if treatment is working. If the treatment doesnot seem to be working, you may need more tests. Follow these instructions at home: Medicines Take over-the-counter and prescription medicines only as told by your doctor. This includes iron pills and vitamins. Take iron pills when your stomach is  empty. If you cannot handle this, take them with food. Do not drink milk or take antacids at the same time as your iron pills. Iron pills may turn your poop (stool)black. If you cannot handle taking iron pills by mouth, ask your doctor about getting iron through: An IV tube. A shot (injection) into a muscle. Eating and drinking  Talk with your doctor before changing the foods you eat. He or she may tell you to eat foods that have a lot of iron, such as: Liver. Low-fat (lean) beef. Breads and cereals that have iron added to them. Eggs. Dried fruit. Dark green, leafy vegetables. Eat fresh fruits and vegetables that are high in vitamin C. They help your body use iron. Foods with a lot of vitamin C include: Oranges. Peppers. Tomatoes. Mangoes. Drink enough fluid to keep your pee (urine) pale yellow.  Managing constipation If you are taking iron pills, they may cause trouble pooping (constipation). To prevent or treat trouble pooping, you may need to: Take over-the-counter or prescription medicines. Eat foods that are high in fiber. These include beans, whole grains, and fresh fruits and vegetables. Limit foods that are high in fat and sugar. These include fried or sweet foods. General instructions Return to your normal activities as told by your doctor. Ask your doctor what activities are safe for you. Keep yourself clean, and keep things clean around you. Keep all follow-up visits as told by your doctor. This is important. Contact a doctor if: You feel like you may vomit (nauseous), or you vomit. You feel   weak. You are sweating for no reason. You have trouble pooping, such as: Pooping less than 3 times a week. Straining to poop. Having poop that is hard, dry, or larger than normal. Feeling full or bloated. Pain in the lower belly. Not feeling better after pooping. Get help right away if: You pass out (faint). You have chest pain. You have trouble breathing that: Is very  bad. Gets worse with physical activity. You have a fast heartbeat, or a heartbeat that does not feel regular. You get light-headed when getting up from sitting or lying down. These symptoms may be an emergency. Do not wait to see if the symptoms will go away. Get medical help right away. Call your local emergency services (911 in the [place] Do not drive yourself to the hospital. Summary Iron deficiency anemia is when you have too little iron in your body. This condition is treated by finding out why you do not have enough iron in your body and then getting more iron. Take over-the-counter and prescription medicines only as told by your doctor. Eat fresh fruits and vegetables that are high in vitamin C. Get help right away if you cannot breathe well. This information is not intended to replace advice given to you by your health care provider. Make sure you discuss any questions you have with your healthcare provider. Document Revised: 05/10/2019 Document Reviewed: 05/10/2019 Elsevier Patient Education  2022 Elsevier Inc.  

## 2021-10-25 ENCOUNTER — Telehealth: Payer: Self-pay

## 2021-10-25 NOTE — Telephone Encounter (Signed)
10/18/21 got call from [place] for critical labs on pt.   IRON: 8 IRON SAT: 2 HEMOGLOBIN: 5.2 MCHC: 24.8  Spoke to [facility] and informed her of the critical labs and she had already received the alert and spoke to the pt and advised pt was to come to the office on Monday 10/21/21

## 2021-10-31 ENCOUNTER — Ambulatory Visit (INDEPENDENT_AMBULATORY_CARE_PROVIDER_SITE_OTHER): Payer: BC Managed Care – PPO

## 2021-10-31 ENCOUNTER — Inpatient Hospital Stay: Payer: BC Managed Care – PPO

## 2021-10-31 ENCOUNTER — Other Ambulatory Visit: Payer: Self-pay

## 2021-10-31 DIAGNOSIS — E538 Deficiency of other specified B group vitamins: Secondary | ICD-10-CM

## 2021-10-31 MED ORDER — CYANOCOBALAMIN 1000 MCG/ML IJ SOLN: 1000.0000 ug | Freq: Once | INTRAMUSCULAR | Status: AC

## 2021-11-06 ENCOUNTER — Ambulatory Visit: Payer: BC Managed Care – PPO

## 2021-11-06 ENCOUNTER — Other Ambulatory Visit: Payer: BC Managed Care – PPO

## 2021-11-07 ENCOUNTER — Inpatient Hospital Stay: Payer: BC Managed Care – PPO

## 2021-11-07 ENCOUNTER — Ambulatory Visit: Payer: BC Managed Care – PPO

## 2021-11-14 ENCOUNTER — Other Ambulatory Visit: Payer: Self-pay

## 2021-11-14 ENCOUNTER — Ambulatory Visit (INDEPENDENT_AMBULATORY_CARE_PROVIDER_SITE_OTHER): Payer: BC Managed Care – PPO

## 2021-11-14 ENCOUNTER — Inpatient Hospital Stay: Payer: BC Managed Care – PPO | Attending: Oncology

## 2021-11-14 ENCOUNTER — Ambulatory Visit: Payer: BC Managed Care – PPO

## 2021-11-14 DIAGNOSIS — E538 Deficiency of other specified B group vitamins: Secondary | ICD-10-CM | POA: Diagnosis not present

## 2021-11-14 MED ORDER — CYANOCOBALAMIN 1000 MCG/ML IJ SOLN
1000.0000 ug | Freq: Once | INTRAMUSCULAR | Status: AC
  Administered 2021-11-14: 1000 ug via INTRAMUSCULAR

## 2021-11-15 ENCOUNTER — Ambulatory Visit: Payer: BC Managed Care – PPO | Admitting: Physician Assistant

## 2021-11-21 ENCOUNTER — Inpatient Hospital Stay: Payer: BC Managed Care – PPO | Attending: Oncology

## 2021-11-28 ENCOUNTER — Telehealth: Payer: Self-pay

## 2021-11-28 NOTE — Telephone Encounter (Signed)
When pot called to request labs to be ordered when explaining that pt needed to f/u with hematology pt hung up on me and there was 2 other nurses in the office with that witnessed the phone call. ?

## 2021-11-28 NOTE — Telephone Encounter (Signed)
Pt called and was requesting to have labs ordered due to pt doing her own regimen of treating her own low hemoglobin.  Pt advised that the hematology dr  wanted her to try iron liquid and pt refused and researched to find her own regimen in treating herself.  Per Lauren I informed pt that she would need to still f/u with hematology and let them know that pt didn't want to do their treatment and tell them that she has tried something for treatment on her own and to see if they are ok with ordering labs to see if pt's own treatment was working.  As I was explaining multiple times that pt needs to f/u with hematology pt hung up the phone on me. ?

## 2021-12-18 ENCOUNTER — Ambulatory Visit: Payer: BC Managed Care – PPO | Admitting: Gastroenterology

## 2021-12-18 NOTE — Progress Notes (Deleted)
? ? ?Gastroenterology Consultation ? ?Referring Provider:     [facility] [place] ?Primary Care Physician:  Mylinda Latina, PA-C ?Primary Gastroenterologist:  Dr. Allen Norris     ?Reason for Consultation:     Iron deficiency anemia ?      ? HPI:   ?Sherry Mack is a 36 y.o. y/o female referred for consultation & management of iron deficiency anemia by Dr. Chauncey Cruel, Si Gaul, PA-C.  This patient comes in today with iron deficiency anemia.  The patient's most recent lab work showed her iron to be: ? ?Component ?    Latest Ref Rng 10/18/2021  ?TIBC ?    250 - 450 ug/dL 351   ?UIBC ?    131 - 425 ug/dL 343   ?Iron ?    27 - 159 ug/dL 8 (LL)   ?Iron Saturation ?    15 - 55 % 2 (LL)   ?Ferritin ?    15 - 150 ng/mL 3 (L)   ? ?Her trending of her hemoglobin has also shown to be low with the trends below: ? ?Component ?    Latest Ref Rng 02/20/2020 02/21/2021 10/18/2021 10/20/2021  ?Hemoglobin ?    12.0 - 15.0 g/dL 12.2   5.2 (LL)  5.3 (L)   ?HCT ?    36.0 - 46.0 % 36.5   21.0 (L)  21.5 (L)   ?MCV ?    80.0 - 100.0 fL 80   58 (L)  58.9 (L)   ? ?The patient was seen by hematology and recommended to see [place] for evaluation of her iron deficiency anemia.  The patient had been sent to hematology by the emergency department where the patient had presented with shortness of breath and weakness and was found to have profound anemia.  The patient had reported to the hematologist that she was having heavy periods since having a tubal ligation. ? ?Past Medical History:  ?Diagnosis Date  ? Iron deficiency anemia due to chronic blood loss 10/23/2021  ? STD (sexually transmitted disease)   ? 20+ years ago- trich in pap 06/12/2016  ? ? ?Past Surgical History:  ?Procedure Laterality Date  ? TUBAL LIGATION    ? ? ?Prior to Admission medications   ?Medication Sig Start Date End Date Taking? Authorizing Provider  ?Multiple Vitamin (MULTIVITAMIN) tablet Take 1 tablet by mouth daily.    [provider]  ? ? ?Family History  ?Problem Relation Age of  Onset  ? Heart disease Mother   ? Stroke Maternal Grandmother   ? Heart disease Maternal Grandmother   ? Breast cancer Neg Hx   ? Ovarian cancer Neg Hx   ? Colon cancer Neg Hx   ? Cancer Neg Hx   ?  ? ?Social History  ? ?Tobacco Use  ? Smoking status: Never  ? Smokeless tobacco: Never  ?Vaping Use  ? Vaping Use: Never used  ?Substance Use Topics  ? Alcohol use: Yes  ?  Alcohol/week: 2.0 standard drinks  ?  Types: 2 Standard drinks or equivalent per week  ?  Comment: last use 07/06/21 (special occasions)  ? Drug use: Not Currently  ?  Types: Marijuana  ?  Comment: last use age 12  ? ? ?Allergies as of 12/18/2021  ? (No Known Allergies)  ? ? ?Review of Systems:    ?All systems reviewed and negative except where noted in HPI. ? ? Physical Exam:  ?There were no vitals taken for this visit. ?No LMP  recorded. ?General:   Alert,  Well-developed, well-nourished, pleasant and cooperative in NAD ?Head:  Normocephalic and atraumatic. ?Eyes:  Sclera clear, no icterus.   Conjunctiva pink. ?Ears:  Normal auditory acuity. ?Neck:  Supple; no masses or thyromegaly. ?Lungs:  Respirations even and unlabored.  Clear throughout to auscultation.   No wheezes, crackles, or rhonchi. No acute distress. ?Heart:  Regular rate and rhythm; no murmurs, clicks, rubs, or gallops. ?Abdomen:  Normal bowel sounds.  No bruits.  Soft, non-tender and non-distended without masses, hepatosplenomegaly or hernias noted.  No guarding or rebound tenderness.  Negative Carnett sign.   ?Rectal:  Deferred.  ?Pulses:  Normal pulses noted. ?Extremities:  No clubbing or edema.  No cyanosis. ?Neurologic:  Alert and oriented x3;  grossly normal neurologically. ?Skin:  Intact without significant lesions or rashes.  No jaundice. ?Lymph Nodes:  No significant cervical adenopathy. ?Psych:  Alert and cooperative. Normal mood and affect. ? ?Imaging Studies: ?No results found. ? ?Assessment and Plan:  ? ?Sherry Mack is a 36 y.o. y/o female *** ? ? ? ?Lucilla Lame, MD.  Marval Regal ? ? ? Note: This dictation was prepared with Dragon dictation along with smaller phrase technology. Any transcriptional errors that result from this process are unintentional.   ?

## 2021-12-19 ENCOUNTER — Ambulatory Visit: Payer: BC Managed Care – PPO

## 2021-12-24 ENCOUNTER — Encounter: Payer: Self-pay | Admitting: Obstetrics and Gynecology

## 2021-12-24 DIAGNOSIS — Z7689 Persons encountering health services in other specified circumstances: Secondary | ICD-10-CM

## 2021-12-24 DIAGNOSIS — N926 Irregular menstruation, unspecified: Secondary | ICD-10-CM

## 2021-12-24 DIAGNOSIS — D259 Leiomyoma of uterus, unspecified: Secondary | ICD-10-CM

## 2021-12-24 DIAGNOSIS — N83201 Unspecified ovarian cyst, right side: Secondary | ICD-10-CM

## 2022-01-16 ENCOUNTER — Ambulatory Visit: Payer: BC Managed Care – PPO

## 2022-01-23 ENCOUNTER — Inpatient Hospital Stay: Payer: BC Managed Care – PPO | Attending: Oncology

## 2022-01-23 DIAGNOSIS — D509 Iron deficiency anemia, unspecified: Secondary | ICD-10-CM | POA: Insufficient documentation

## 2022-01-24 ENCOUNTER — Telehealth: Payer: Self-pay

## 2022-01-24 NOTE — Telephone Encounter (Signed)
patient scheduled for MD/ venofer on 5/18. No showed to labs on 5/11.  ? ?Please call pt to r/s lab any day prior to appt on 5/18. ok, to r/s appts if pt wishes to. Labs a few days prior to MD/ Venofer.  ?

## 2022-01-28 ENCOUNTER — Ambulatory Visit (INDEPENDENT_AMBULATORY_CARE_PROVIDER_SITE_OTHER): Payer: BC Managed Care – PPO

## 2022-01-28 ENCOUNTER — Inpatient Hospital Stay: Payer: BC Managed Care – PPO

## 2022-01-28 DIAGNOSIS — E538 Deficiency of other specified B group vitamins: Secondary | ICD-10-CM | POA: Diagnosis not present

## 2022-01-28 DIAGNOSIS — D500 Iron deficiency anemia secondary to blood loss (chronic): Secondary | ICD-10-CM

## 2022-01-28 DIAGNOSIS — D509 Iron deficiency anemia, unspecified: Secondary | ICD-10-CM | POA: Diagnosis present

## 2022-01-28 LAB — CBC WITH DIFFERENTIAL/PLATELET
Abs Immature Granulocytes: 0.03 10*3/uL (ref 0.00–0.07)
Basophils Absolute: 0.1 10*3/uL (ref 0.0–0.1)
Basophils Relative: 1 %
Eosinophils Absolute: 0.5 10*3/uL (ref 0.0–0.5)
Eosinophils Relative: 5 %
HCT: 33.7 % — ABNORMAL LOW (ref 36.0–46.0)
Hemoglobin: 10.2 g/dL — ABNORMAL LOW (ref 12.0–15.0)
Immature Granulocytes: 0 %
Lymphocytes Relative: 18 %
Lymphs Abs: 1.9 10*3/uL (ref 0.7–4.0)
MCH: 22.7 pg — ABNORMAL LOW (ref 26.0–34.0)
MCHC: 30.3 g/dL (ref 30.0–36.0)
MCV: 75.1 fL — ABNORMAL LOW (ref 80.0–100.0)
Monocytes Absolute: 0.7 10*3/uL (ref 0.1–1.0)
Monocytes Relative: 7 %
Neutro Abs: 7.1 10*3/uL (ref 1.7–7.7)
Neutrophils Relative %: 69 %
Platelets: 327 10*3/uL (ref 150–400)
RBC: 4.49 MIL/uL (ref 3.87–5.11)
RDW: 18.7 % — ABNORMAL HIGH (ref 11.5–15.5)
WBC: 10.3 10*3/uL (ref 4.0–10.5)
nRBC: 0 % (ref 0.0–0.2)

## 2022-01-28 LAB — IRON AND TIBC
Iron: 19 ug/dL — ABNORMAL LOW (ref 28–170)
Saturation Ratios: 6 % — ABNORMAL LOW (ref 10.4–31.8)
TIBC: 328 ug/dL (ref 250–450)
UIBC: 309 ug/dL

## 2022-01-28 LAB — FERRITIN: Ferritin: 11 ng/mL (ref 11–307)

## 2022-01-28 MED ORDER — CYANOCOBALAMIN 1000 MCG/ML IJ SOLN
1000.0000 ug | Freq: Once | INTRAMUSCULAR | Status: AC
  Administered 2022-01-28: 1000 ug via INTRAMUSCULAR

## 2022-01-30 ENCOUNTER — Inpatient Hospital Stay: Payer: BC Managed Care – PPO

## 2022-01-30 ENCOUNTER — Encounter: Payer: Self-pay | Admitting: Oncology

## 2022-01-30 ENCOUNTER — Inpatient Hospital Stay: Payer: BC Managed Care – PPO | Admitting: Oncology

## 2022-02-13 ENCOUNTER — Ambulatory Visit: Payer: BC Managed Care – PPO

## 2022-02-20 ENCOUNTER — Telehealth: Payer: Self-pay

## 2022-02-20 NOTE — Telephone Encounter (Signed)
No answer, sent mychart message to confirm 02/27/22 appointment-Toni

## 2022-02-24 ENCOUNTER — Ambulatory Visit: Payer: BC Managed Care – PPO

## 2022-02-26 ENCOUNTER — Encounter: Payer: BC Managed Care – PPO | Admitting: Nurse Practitioner

## 2022-02-27 ENCOUNTER — Encounter: Payer: BC Managed Care – PPO | Admitting: Physician Assistant

## 2022-03-12 ENCOUNTER — Ambulatory Visit: Payer: BC Managed Care – PPO

## 2022-03-20 ENCOUNTER — Encounter: Payer: Self-pay | Admitting: Physician Assistant

## 2022-03-20 ENCOUNTER — Ambulatory Visit (INDEPENDENT_AMBULATORY_CARE_PROVIDER_SITE_OTHER): Payer: BC Managed Care – PPO | Admitting: Physician Assistant

## 2022-03-20 DIAGNOSIS — Z202 Contact with and (suspected) exposure to infections with a predominantly sexual mode of transmission: Secondary | ICD-10-CM | POA: Diagnosis not present

## 2022-03-20 DIAGNOSIS — R300 Dysuria: Secondary | ICD-10-CM | POA: Diagnosis not present

## 2022-03-20 DIAGNOSIS — E538 Deficiency of other specified B group vitamins: Secondary | ICD-10-CM

## 2022-03-20 LAB — POCT URINALYSIS DIPSTICK
Bilirubin, UA: NEGATIVE
Blood, UA: NEGATIVE
Glucose, UA: NEGATIVE
Leukocytes, UA: NEGATIVE
Nitrite, UA: NEGATIVE
Protein, UA: NEGATIVE
Spec Grav, UA: 1.01 (ref 1.010–1.025)
Urobilinogen, UA: 0.2 E.U./dL
pH, UA: 6.5 (ref 5.0–8.0)

## 2022-03-20 MED ORDER — CYANOCOBALAMIN 1000 MCG/ML IJ SOLN
1000.0000 ug | Freq: Once | INTRAMUSCULAR | Status: AC
  Administered 2022-03-20: 1000 ug via INTRAMUSCULAR

## 2022-03-20 NOTE — Progress Notes (Signed)
[facility] [place], [place] [address]  Internal MEDICINE  Office Visit Note  Patient Name: Sherry Mack  [id number]  [id number]  Date of Service: 03/26/2022  Chief Complaint  Patient presents with   Urinary Tract Infection   SEXUALLY TRANSMITTED DISEASE     HPI Pt is here for a sick visit. -Has been having some vaginal irritation and disharge with fishy smell. Did some reading started probiotic and helped some. -Would like to be screened for STD and will check for BV and yeast with swab as well.  -Denies urinary frequency or burning, but a urine will be dipped to make sure no signs of UTI either  Current Medication:  Outpatient Encounter Medications as of 03/20/2022  Medication Sig   Multiple Vitamin (MULTIVITAMIN) tablet Take 1 tablet by mouth daily.   Facility-Administered Encounter Medications as of 03/20/2022  Medication   cyanocobalamin ((VITAMIN B-12)) injection 1,000 mcg   [COMPLETED] cyanocobalamin ((VITAMIN B-12)) injection 1,000 mcg      Medical History: Past Medical History:  Diagnosis Date   Iron deficiency anemia due to chronic blood loss 10/23/2021   STD (sexually transmitted disease)    20+ years ago- trich in pap 06/12/2016     Vital Signs: BP 111/62   Pulse 81   Temp 98.4 F (36.9 C)   Resp 16   Ht '5\' 8"'$  (1.727 m)   Wt 240 lb (108.9 kg)   SpO2 99%   BMI 36.49 kg/m    Review of Systems  Constitutional:  Negative for fatigue and fever.  HENT:  Negative for congestion, mouth sores and postnasal drip.   Respiratory:  Negative for cough.   Cardiovascular:  Negative for chest pain.  Genitourinary:  Positive for vaginal discharge. Negative for dysuria, flank pain, frequency and urgency.  Psychiatric/Behavioral: Negative.      Physical Exam Vitals and nursing note reviewed.  Constitutional:      General: She is not in acute distress.    Appearance: She is well-developed. She is obese. She is not diaphoretic.   HENT:     Head: Normocephalic and atraumatic.     Mouth/Throat:     Pharynx: No oropharyngeal exudate.  Eyes:     Pupils: Pupils are equal, round, and reactive to light.  Neck:     Thyroid: No thyromegaly.     Vascular: No JVD.     Trachea: No tracheal deviation.  Cardiovascular:     Rate and Rhythm: Normal rate and regular rhythm.     Heart sounds: Normal heart sounds. No murmur heard.    No friction rub. No gallop.  Pulmonary:     Effort: Pulmonary effort is normal. No respiratory distress.     Breath sounds: No wheezing or rales.  Chest:     Chest wall: No tenderness.  Abdominal:     General: Bowel sounds are normal.     Palpations: Abdomen is soft.  Musculoskeletal:        General: Normal range of motion.     Cervical back: Normal range of motion and neck supple.  Lymphadenopathy:     Cervical: No cervical adenopathy.  Skin:    General: Skin is warm and dry.  Neurological:     Mental Status: She is alert and oriented to person, place, and time.     Cranial Nerves: No cranial nerve deficit.  Psychiatric:        Behavior: Behavior normal.        Thought  Content: Thought content normal.        Judgment: Judgment normal.       Assessment/Plan: 1. Possible exposure to STD Pt performed self swab and will notify her of results when available and treat accordingly - NuSwab Vaginitis Plus (VG+)  2. Dysuria - POCT Urinalysis Dipstick neg leukocytes/nitrites  3. B12 deficiency - cyanocobalamin ((VITAMIN B-12)) injection 1,000 mcg   General Counseling: Sherry Mack verbalizes understanding of the findings of todays visit and agrees with plan of treatment. I have discussed any further diagnostic evaluation that may be needed or ordered today. We also reviewed her medications today. she has been encouraged to call the office with any questions or concerns that should arise related to todays visit.    Counseling:    Orders Placed This Encounter  Procedures   NuSwab  Vaginitis Plus (VG+)   POCT Urinalysis Dipstick    Meds ordered this encounter  Medications   cyanocobalamin ((VITAMIN B-12)) injection 1,000 mcg    Time spent:25 Minutes

## 2022-03-22 LAB — NUSWAB VAGINITIS PLUS (VG+)
Candida albicans, NAA: POSITIVE — AB
Candida glabrata, NAA: NEGATIVE
Chlamydia trachomatis, NAA: NEGATIVE
Neisseria gonorrhoeae, NAA: NEGATIVE
Trich vag by NAA: NEGATIVE

## 2022-03-24 ENCOUNTER — Other Ambulatory Visit: Payer: Self-pay

## 2022-03-24 ENCOUNTER — Telehealth: Payer: Self-pay

## 2022-03-24 MED ORDER — FLUCONAZOLE 150 MG PO TABS: ORAL_TABLET | ORAL | 0 refills | Status: DC

## 2022-03-24 NOTE — Telephone Encounter (Signed)
Was able to reach patient and inform her about NuSwab results. Patient agreed to take one tab of Diflucan on 03/24/2022.

## 2022-03-24 NOTE — Telephone Encounter (Signed)
-----   Message from Mylinda Latina, PA-C sent at 03/24/2022  8:42 AM EDT ----- Please let her know that her swab did show some yeast, but was otherwise negative and please send a tab of diflucan for her if she would like

## 2022-03-24 NOTE — Telephone Encounter (Signed)
Tried to reach patient in order to review NuSwab test results, but could not get a hold of patient. No voicemail set up.

## 2022-04-14 ENCOUNTER — Ambulatory Visit (INDEPENDENT_AMBULATORY_CARE_PROVIDER_SITE_OTHER): Payer: BC Managed Care – PPO | Admitting: Physician Assistant

## 2022-04-14 ENCOUNTER — Encounter: Payer: Self-pay | Admitting: Physician Assistant

## 2022-04-14 VITALS — BP 107/74 | HR 101 | Temp 98.3°F | Resp 16 | Ht 68.0 in | Wt 248.6 lb

## 2022-04-14 DIAGNOSIS — R300 Dysuria: Secondary | ICD-10-CM | POA: Diagnosis not present

## 2022-04-14 DIAGNOSIS — E538 Deficiency of other specified B group vitamins: Secondary | ICD-10-CM

## 2022-04-14 LAB — POCT URINALYSIS DIPSTICK
Bilirubin, UA: NEGATIVE
Glucose, UA: NEGATIVE
Nitrite, UA: NEGATIVE
Protein, UA: NEGATIVE
Spec Grav, UA: 1.015 (ref 1.010–1.025)
Urobilinogen, UA: 0.2 E.U./dL
pH, UA: 6 (ref 5.0–8.0)

## 2022-04-14 MED ORDER — FLUCONAZOLE 150 MG PO TABS: ORAL_TABLET | ORAL | 0 refills | Status: DC

## 2022-04-14 MED ORDER — NITROFURANTOIN MONOHYD MACRO 100 MG PO CAPS: ORAL_CAPSULE | ORAL | 0 refills | Status: DC

## 2022-04-14 MED ORDER — CYANOCOBALAMIN 1000 MCG/ML IJ SOLN
1000.0000 ug | Freq: Once | INTRAMUSCULAR | Status: AC
  Administered 2022-04-14: 1000 ug via INTRAMUSCULAR

## 2022-04-14 NOTE — Progress Notes (Signed)
[facility] [place], [place] [address]  Internal MEDICINE  Office Visit Note  Patient Name: Sherry Mack  [id number]  [id number]  Date of Service: 04/14/2022  Chief Complaint  Patient presents with   Urinary Tract Infection    Back pain, frequently urinating, pelvic pressure - Was taking Diflucan, then this began after stopping     HPI Pt is here for a sick visit. -started having urinary symptoms last week and did cranberry juice, but got worse over the weekend -Left side back pain started today.  -She is having Urgency and frequency, and pelvic pressure, no burning  Current Medication:  Outpatient Encounter Medications as of 04/14/2022  Medication Sig   Multiple Vitamin (MULTIVITAMIN) tablet Take 1 tablet by mouth daily.   nitrofurantoin, macrocrystal-monohydrate, (MACROBID) 100 MG capsule Take 1 cap twice per day for 10 days.   [DISCONTINUED] fluconazole (DIFLUCAN) 150 MG tablet Take 1 tablet by mouth once and may repeat in 3 days if symptoms persist   fluconazole (DIFLUCAN) 150 MG tablet Take 1 tablet by mouth once and may repeat in 3 days if symptoms persist   Facility-Administered Encounter Medications as of 04/14/2022  Medication   cyanocobalamin ((VITAMIN B-12)) injection 1,000 mcg   [COMPLETED] cyanocobalamin (VITAMIN B12) injection 1,000 mcg      Medical History: Past Medical History:  Diagnosis Date   Iron deficiency anemia due to chronic blood loss 10/23/2021   STD (sexually transmitted disease)    20+ years ago- trich in pap 06/12/2016     Vital Signs: BP 107/74   Pulse (!) 101   Temp 98.3 F (36.8 C)   Resp 16   Ht '5\' 8"'$  (1.727 m)   Wt 248 lb 9.6 oz (112.8 kg)   SpO2 99%   BMI 37.80 kg/m    Review of Systems  Constitutional:  Negative for fatigue and fever.  HENT:  Negative for congestion, mouth sores and postnasal drip.   Respiratory:  Negative for cough.   Cardiovascular:  Negative for chest pain.   Genitourinary:  Positive for flank pain, frequency and urgency.  Psychiatric/Behavioral: Negative.      Physical Exam Vitals and nursing note reviewed.  Constitutional:      General: She is not in acute distress.    Appearance: She is well-developed. She is obese. She is not diaphoretic.  HENT:     Head: Normocephalic and atraumatic.     Mouth/Throat:     Pharynx: No oropharyngeal exudate.  Eyes:     Pupils: Pupils are equal, round, and reactive to light.  Neck:     Thyroid: No thyromegaly.     Vascular: No JVD.     Trachea: No tracheal deviation.  Cardiovascular:     Rate and Rhythm: Normal rate and regular rhythm.     Heart sounds: Normal heart sounds. No murmur heard.    No friction rub. No gallop.  Pulmonary:     Effort: Pulmonary effort is normal. No respiratory distress.     Breath sounds: No wheezing or rales.  Chest:     Chest wall: No tenderness.  Abdominal:     General: Bowel sounds are normal.     Palpations: Abdomen is soft.     Tenderness: There is no right CVA tenderness or left CVA tenderness.  Musculoskeletal:        General: Normal range of motion.     Cervical back: Normal range of motion and neck supple.  Lymphadenopathy:  Cervical: No cervical adenopathy.  Skin:    General: Skin is warm and dry.  Neurological:     Mental Status: She is alert and oriented to person, place, and time.     Cranial Nerves: No cranial nerve deficit.  Psychiatric:        Behavior: Behavior normal.        Thought Content: Thought content normal.        Judgment: Judgment normal.       Assessment/Plan: 1. Dysuria Will go ahead and start on macrobid and will adjust based on C/S - POCT Urinalysis Dipstick - CULTURE, URINE COMPREHENSIVE - nitrofurantoin, macrocrystal-monohydrate, (MACROBID) 100 MG capsule; Take 1 cap twice per day for 10 days.  Dispense: 20 capsule; Refill: 0  2. B12 deficiency - cyanocobalamin (VITAMIN B12) injection 1,000 mcg   General  Counseling: Marialuisa verbalizes understanding of the findings of todays visit and agrees with plan of treatment. I have discussed any further diagnostic evaluation that may be needed or ordered today. We also reviewed her medications today. she has been encouraged to call the office with any questions or concerns that should arise related to todays visit.    Counseling:    Orders Placed This Encounter  Procedures   CULTURE, URINE COMPREHENSIVE   POCT Urinalysis Dipstick    Meds ordered this encounter  Medications   cyanocobalamin (VITAMIN B12) injection 1,000 mcg   nitrofurantoin, macrocrystal-monohydrate, (MACROBID) 100 MG capsule    Sig: Take 1 cap twice per day for 10 days.    Dispense:  20 capsule    Refill:  0   fluconazole (DIFLUCAN) 150 MG tablet    Sig: Take 1 tablet by mouth once and may repeat in 3 days if symptoms persist    Dispense:  3 tablet    Refill:  0    Time spent:25 Minutes

## 2022-04-17 ENCOUNTER — Ambulatory Visit: Payer: BC Managed Care – PPO

## 2022-04-17 LAB — CULTURE, URINE COMPREHENSIVE

## 2022-06-13 ENCOUNTER — Encounter: Payer: Self-pay | Admitting: Physician Assistant

## 2022-06-13 ENCOUNTER — Ambulatory Visit (INDEPENDENT_AMBULATORY_CARE_PROVIDER_SITE_OTHER): Payer: BC Managed Care – PPO | Admitting: Physician Assistant

## 2022-06-13 VITALS — BP 118/65 | HR 73 | Temp 98.0°F | Resp 16 | Ht 68.0 in | Wt 249.6 lb

## 2022-06-13 DIAGNOSIS — Z113 Encounter for screening for infections with a predominantly sexual mode of transmission: Secondary | ICD-10-CM | POA: Diagnosis not present

## 2022-06-13 DIAGNOSIS — N921 Excessive and frequent menstruation with irregular cycle: Secondary | ICD-10-CM | POA: Diagnosis not present

## 2022-06-13 DIAGNOSIS — D500 Iron deficiency anemia secondary to blood loss (chronic): Secondary | ICD-10-CM

## 2022-06-13 DIAGNOSIS — N926 Irregular menstruation, unspecified: Secondary | ICD-10-CM

## 2022-06-13 DIAGNOSIS — R300 Dysuria: Secondary | ICD-10-CM

## 2022-06-13 DIAGNOSIS — E559 Vitamin D deficiency, unspecified: Secondary | ICD-10-CM

## 2022-06-13 DIAGNOSIS — E781 Pure hyperglyceridemia: Secondary | ICD-10-CM

## 2022-06-13 DIAGNOSIS — E538 Deficiency of other specified B group vitamins: Secondary | ICD-10-CM | POA: Diagnosis not present

## 2022-06-13 DIAGNOSIS — Z124 Encounter for screening for malignant neoplasm of cervix: Secondary | ICD-10-CM

## 2022-06-13 DIAGNOSIS — Z0001 Encounter for general adult medical examination with abnormal findings: Secondary | ICD-10-CM | POA: Diagnosis not present

## 2022-06-13 DIAGNOSIS — R5383 Other fatigue: Secondary | ICD-10-CM

## 2022-06-13 NOTE — Progress Notes (Signed)
[facility] [place], [place] [address]  Internal MEDICINE  Office Visit Note  Patient Name: Sherry Mack  [id number]  [id number]  Date of Service: 06/24/2022  Chief Complaint  Patient presents with   Annual Exam   Weight Loss     HPI Pt is here for routine health maintenance examination and has no new concerns today -Still working on weight loss and will start focusing on exercise and diet -Not feeling exhausted or SOB now since hemoglobin improved. Attributed this to heavy cycles which she still has. Would recommend evaluation with OBGYN due to heavy irregular cycles causing IDA. May also need to follow up with hematology as she hasn't gone back, but will check labs first. -Will recheck labs today and therefore will hold off on b12 until next week -pap last year, would still like to self swab for STD/yeast check  Current Medication: Outpatient Encounter Medications as of 06/13/2022  Medication Sig   fluconazole (DIFLUCAN) 150 MG tablet Take 1 tablet by mouth once and may repeat in 3 days if symptoms persist   Multiple Vitamin (MULTIVITAMIN) tablet Take 1 tablet by mouth daily.   nitrofurantoin, macrocrystal-monohydrate, (MACROBID) 100 MG capsule Take 1 cap twice per day for 10 days.   Facility-Administered Encounter Medications as of 06/13/2022  Medication   cyanocobalamin ((VITAMIN B-12)) injection 1,000 mcg    Surgical History: Past Surgical History:  Procedure Laterality Date   TUBAL LIGATION      Medical History: Past Medical History:  Diagnosis Date   Iron deficiency anemia due to chronic blood loss 10/23/2021   STD (sexually transmitted disease)    20+ years ago- trich in pap 06/12/2016    Family History: Family History  Problem Relation Age of Onset   Heart disease Mother    Stroke Maternal Grandmother    Heart disease Maternal Grandmother    Breast cancer Neg Hx    Ovarian cancer Neg Hx    Colon cancer Neg Hx    Cancer Neg  Hx       Review of Systems  Constitutional:  Negative for chills, fatigue and unexpected weight change.  HENT:  Negative for congestion, postnasal drip, rhinorrhea, sneezing and sore throat.   Eyes:  Negative for redness.  Respiratory:  Negative for cough, chest tightness and shortness of breath.   Cardiovascular:  Negative for chest pain and palpitations.  Gastrointestinal:  Negative for abdominal pain, constipation, diarrhea, nausea and vomiting.  Genitourinary:  Positive for menstrual problem. Negative for dysuria and frequency.  Musculoskeletal:  Negative for arthralgias, back pain, joint swelling and neck pain.  Skin:  Negative for rash.  Neurological: Negative.  Negative for tremors and numbness.  Hematological:  Negative for adenopathy. Does not bruise/bleed easily.  Psychiatric/Behavioral:  Negative for behavioral problems (Depression), sleep disturbance and suicidal ideas. The patient is not nervous/anxious.      Vital Signs: BP 118/65   Pulse 73   Temp 98 F (36.7 C)   Resp 16   Ht _0  (1.727 m)   Wt 249 lb 9.6 oz (113.2 kg)   SpO2 100%   BMI 37.95 kg/m    Physical Exam Vitals and nursing note reviewed.  Constitutional:      General: She is not in acute distress.    Appearance: She is well-developed. She is obese. She is not diaphoretic.  HENT:     Head: Normocephalic and atraumatic.     Mouth/Throat:     Pharynx: No oropharyngeal  exudate.  Eyes:     Pupils: Pupils are equal, round, and reactive to light.  Neck:     Thyroid: No thyromegaly.     Vascular: No JVD.     Trachea: No tracheal deviation.  Cardiovascular:     Rate and Rhythm: Normal rate and regular rhythm.     Heart sounds: Normal heart sounds. No murmur heard.    No friction rub. No gallop.  Pulmonary:     Effort: Pulmonary effort is normal. No respiratory distress.     Breath sounds: No wheezing or rales.  Chest:     Chest wall: No tenderness.  Abdominal:     General: Bowel sounds are  normal.     Palpations: Abdomen is soft.     Tenderness: There is no abdominal tenderness.  Musculoskeletal:        General: Normal range of motion.     Cervical back: Normal range of motion and neck supple.  Lymphadenopathy:     Cervical: No cervical adenopathy.  Skin:    General: Skin is warm and dry.  Neurological:     Mental Status: She is alert and oriented to person, place, and time.     Cranial Nerves: No cranial nerve deficit.  Psychiatric:        Behavior: Behavior normal.        Thought Content: Thought content normal.        Judgment: Judgment normal.      LABS: Recent Results (from the past 2160 hour(s))  POCT Urinalysis Dipstick     Status: Abnormal   Collection Time: 04/14/22  3:24 PM  Result Value Ref Range   Color, UA     Clarity, UA     Glucose, UA Negative Negative   Bilirubin, UA Negative    Ketones, UA Trace    Spec Grav, UA 1.015 1.010 - 1.025   Blood, UA Large    pH, UA 6.0 5.0 - 8.0   Protein, UA Negative Negative   Urobilinogen, UA 0.2 0.2 or 1.0 E.U./dL   Nitrite, UA Negative    Leukocytes, UA Large (3+) (A) Negative   Appearance     Odor    CULTURE, URINE COMPREHENSIVE     Status: Abnormal   Collection Time: 04/14/22  3:29 PM   Specimen: Urine   Urine  Result Value Ref Range   Urine Culture, Comprehensive Final report (A)    Organism ID, Bacteria Escherichia coli (A)     Comment: Cefazolin <=4 ug/mL Cefazolin with an MIC <=16 predicts susceptibility to the oral agents cefaclor, cefdinir, cefpodoxime, cefprozil, cefuroxime, cephalexin, and loracarbef when used for therapy of uncomplicated urinary tract infections due to E. coli, Klebsiella pneumoniae, and Proteus mirabilis. Greater than 100,000 colony forming units per mL    ANTIMICROBIAL SUSCEPTIBILITY Comment     Comment:       ** S = Susceptible; I = Intermediate; R = Resistant **                    P = Positive; N = Negative             MICS are expressed in micrograms per mL     Antibiotic                 RSLT#1    RSLT#2    RSLT#3    RSLT#4 Amoxicillin/Clavulanic Acid    S Ampicillin  R Cefepime                       S Ceftriaxone                    S Cefuroxime                     S Ciprofloxacin                  S Ertapenem                      S Gentamicin                     S Imipenem                       S Levofloxacin                   S Meropenem                      S Nitrofurantoin                 S Piperacillin/Tazobactam        S Tetracycline                   S Tobramycin                     S Trimethoprim/Sulfa             S   B12 and Folate Panel     Status: None   Collection Time: 06/13/22 10:50 AM  Result Value Ref Range   Vitamin B-12 689 232 - 1,245 pg/mL   Folate 17.5 >3.0 ng/mL    Comment: A serum folate concentration of less than 3.1 ng/mL is considered to represent clinical deficiency.   Comprehensive metabolic panel     Status: Abnormal   Collection Time: 06/13/22 10:50 AM  Result Value Ref Range   Glucose 91 70 - 99 mg/dL   BUN 11 6 - 20 mg/dL   Creatinine, Ser 0.85 0.57 - 1.00 mg/dL   eGFR 90 >59 mL/min/1.73   BUN/Creatinine Ratio 13 9 - 23   Sodium 138 134 - 144 mmol/L   Potassium 4.6 3.5 - 5.2 mmol/L   Chloride 103 96 - 106 mmol/L   CO2 22 20 - 29 mmol/L   Calcium 8.5 (L) 8.7 - 10.2 mg/dL   Total Protein 5.7 (L) 6.0 - 8.5 g/dL   Albumin 3.5 (L) 3.9 - 4.9 g/dL   Globulin, Total 2.2 1.5 - 4.5 g/dL   Albumin/Globulin Ratio 1.6 1.2 - 2.2   Bilirubin Total <0.2 0.0 - 1.2 mg/dL   Alkaline Phosphatase 59 44 - 121 IU/L   AST 10 0 - 40 IU/L   ALT 12 0 - 32 IU/L  Fe+TIBC+Fer     Status: Abnormal   Collection Time: 06/13/22 10:50 AM  Result Value Ref Range   Total Iron Binding Capacity 381 250 - 450 ug/dL   UIBC 366 131 - 425 ug/dL   Iron 15 (L) 27 - 159 ug/dL   Iron Saturation 4 (LL) 15 - 55 %   Ferritin 11 (L) 15 - 150 ng/mL  CBC w/Diff/Platelet     Status: Abnormal   Collection Time: 06/13/22  10:50 AM  Result Value  Ref Range   WBC 10.3 3.4 - 10.8 x10E3/uL   RBC 4.42 3.77 - 5.28 x10E6/uL   Hemoglobin 9.1 (L) 11.1 - 15.9 g/dL   Hematocrit 30.7 (L) 34.0 - 46.6 %   MCV 70 (L) 79 - 97 fL   MCH 20.6 (L) 26.6 - 33.0 pg   MCHC 29.6 (L) 31.5 - 35.7 g/dL   RDW 17.2 (H) 11.7 - 15.4 %   Platelets 432 150 - 450 x10E3/uL   Neutrophils 65 Not Estab. %   Lymphs 19 Not Estab. %   Monocytes 8 Not Estab. %   Eos 7 Not Estab. %   Basos 1 Not Estab. %   Neutrophils Absolute 6.8 1.4 - 7.0 x10E3/uL   Lymphocytes Absolute 1.9 0.7 - 3.1 x10E3/uL   Monocytes Absolute 0.8 0.1 - 0.9 x10E3/uL   EOS (ABSOLUTE) 0.7 (H) 0.0 - 0.4 x10E3/uL   Basophils Absolute 0.1 0.0 - 0.2 x10E3/uL   Immature Granulocytes 0 Not Estab. %   Immature Grans (Abs) 0.0 0.0 - 0.1 x10E3/uL  Lipid Panel With LDL/HDL Ratio     Status: Abnormal   Collection Time: 06/13/22 10:50 AM  Result Value Ref Range   Cholesterol, Total 156 100 - 199 mg/dL   Triglycerides 122 0 - 149 mg/dL   HDL 32 (L) >39 mg/dL   VLDL Cholesterol Cal 22 5 - 40 mg/dL   LDL Chol Calc (NIH) 102 (H) 0 - 99 mg/dL   LDL/HDL Ratio 3.2 0.0 - 3.2 ratio    Comment:                                     LDL/HDL Ratio                                             Men  Women                               1/2 Avg.Risk  1.0    1.5                                   Avg.Risk  3.6    3.2                                2X Avg.Risk  6.2    5.0                                3X Avg.Risk  8.0    6.1   TSH + free T4     Status: None   Collection Time: 06/13/22 10:50 AM  Result Value Ref Range   TSH 1.130 0.450 - 4.500 uIU/mL   Free T4 1.11 0.82 - 1.77 ng/dL  VITAMIN D 25 Hydroxy (Vit-D Deficiency, Fractures)     Status: Abnormal   Collection Time: 06/13/22 10:50 AM  Result Value Ref Range   Vit D, 25-Hydroxy 25.4 (L) 30.0 - 100.0 ng/mL    Comment: Vitamin D deficiency has been defined by the Institute of Medicine and an Endocrine  Society practice guideline as a level  of serum 25-OH vitamin D less than 20 ng/mL (1,2). The Endocrine Society went on to further define vitamin D insufficiency as a level between 21 and 29 ng/mL (2). 1. IOM (Institute of Medicine). 2010. Dietary reference    intakes for calcium and D. [place]: The    [employer]. 2. Holick MF, Binkley [place], Bischoff-Ferrari HA, et al.    Evaluation, treatment, and prevention of vitamin D    deficiency: an Endocrine Society clinical practice    guideline. JCEM. 2011 Jul; 96(7):1911-30.   UA/M w/rflx Culture, Routine     Status: None   Collection Time: 06/13/22 11:54 AM   Specimen: Urine   Urine  Result Value Ref Range   Specific Gravity, UA 1.020 1.005 - 1.030   pH, UA 6.5 5.0 - 7.5   Color, UA Yellow Yellow   Appearance Ur Clear Clear   Leukocytes,UA Negative Negative   Protein,UA Negative Negative/Trace   Glucose, UA Negative Negative   Ketones, UA Negative Negative   RBC, UA Negative Negative   Bilirubin, UA Negative Negative   Urobilinogen, Ur 0.2 0.2 - 1.0 mg/dL   Nitrite, UA Negative Negative   Microscopic Examination Comment     Comment: Microscopic follows if indicated.   Microscopic Examination See below:     Comment: Microscopic was indicated and was performed.   Urinalysis Reflex Comment     Comment: This specimen has reflexed to a Urine Culture.  NuSwab Vaginitis Plus (VG+)     Status: None   Collection Time: 06/13/22 11:54 AM  Result Value Ref Range   Atopobium vaginae Low - 0 Score   BVAB 2 Low - 0 Score   Megasphaera 1 Low - 0 Score    Comment: Calculate total score by adding the 3 individual bacterial vaginosis (BV) marker scores together.  Total score is interpreted as follows: Total score 0-1: Indicates the absence of BV. Total score   2: Indeterminate for BV. Additional clinical                  data should be evaluated to establish a                  diagnosis. Total score 3-6: Indicates the presence of BV. This test was developed and  its performance characteristics determined by [facility].  It has not been cleared or approved by the Food and Drug Administration.    Candida albicans, NAA Negative Negative   Candida glabrata, NAA Negative Negative   Trich vag by NAA Negative Negative   Chlamydia trachomatis, NAA Negative Negative   Neisseria gonorrhoeae, NAA Negative Negative  Microscopic Examination     Status: Abnormal   Collection Time: 06/13/22 11:54 AM   Urine  Result Value Ref Range   WBC, UA 0-5 0 - 5 /hpf   RBC, Urine None seen 0 - 2 /hpf   Epithelial Cells (non renal) 0-10 0 - 10 /hpf   Casts None seen None seen /lpf   Crystals Present (A) N/A   Crystal Type Calcium Oxalate N/A   Bacteria, UA Moderate (A) None seen/Few  Urine Culture, Reflex     Status: None   Collection Time: 06/13/22 11:54 AM   Urine  Result Value Ref Range   Urine Culture, Routine Final report    Organism ID, Bacteria Comment     Comment: Mixed urogenital flora 50,000-100,000 colony forming units per mL  Assessment/Plan: 1. Encounter for general adult medical examination with abnormal findings CPE performed, will update labs, UTD on PHM - B12 and Folate Panel - Comprehensive metabolic panel - Fe+TIBC+Fer - CBC w/Diff/Platelet - Lipid Panel With LDL/HDL Ratio - TSH + free T4 - VITAMIN D 25 Hydroxy (Vit-D Deficiency, Fractures)  2. Iron deficiency anemia due to chronic blood loss Will update labs, may need to have follow up with Hematology pending labs. Will also refer to OBGYN due to irregular and heavy cycles as likely cause of IDA that has require transfusion in the past - Ambulatory referral to Obstetrics / Gynecology - Fe+TIBC+Fer - CBC w/Diff/Platelet  3. Menorrhagia with irregular cycle Will refer to OBGYN due to heavy and irregular cycles causing IDA - Ambulatory referral to Obstetrics / Gynecology  4. Screening for STDs (sexually transmitted diseases) - NuSwab Vaginitis Plus (VG+)  5. B12  deficiency - B12 and Folate Panel  6. Vitamin D deficiency - VITAMIN D 25 Hydroxy (Vit-D Deficiency, Fractures)  7. High triglycerides - Lipid Panel With LDL/HDL Ratio  8. Other fatigue Will check labs  9. Dysuria - UA/M w/rflx Culture, Routine - Microscopic Examination - Urine Culture, Reflex   General Counseling: Adrina verbalizes understanding of the findings of todays visit and agrees with plan of treatment. I have discussed any further diagnostic evaluation that may be needed or ordered today. We also reviewed her medications today. she has been encouraged to call the office with any questions or concerns that should arise related to todays visit.    Counseling:    Orders Placed This Encounter  Procedures   Microscopic Examination   Urine Culture, Reflex   UA/M w/rflx Culture, Routine   NuSwab Vaginitis Plus (VG+)   B12 and Folate Panel   Comprehensive metabolic panel   Fe+TIBC+Fer   CBC w/Diff/Platelet   Lipid Panel With LDL/HDL Ratio   TSH + free T4   VITAMIN D 25 Hydroxy (Vit-D Deficiency, Fractures)   Ambulatory referral to Obstetrics / Gynecology    No orders of the defined types were placed in this encounter.   This patient was seen by Drema Dallas, PA-C in collaboration with Dr. Clayborn Bigness as a part of collaborative care agreement.  Total time spent:35 Minutes  Time spent includes review of chart, medications, test results, and follow up plan with the patient.     Lavera Guise, MD  Internal Medicine

## 2022-06-14 LAB — CBC WITH DIFFERENTIAL/PLATELET
Basophils Absolute: 0.1 10*3/uL (ref 0.0–0.2)
Basos: 1 %
EOS (ABSOLUTE): 0.7 10*3/uL — ABNORMAL HIGH (ref 0.0–0.4)
Eos: 7 %
Hematocrit: 30.7 % — ABNORMAL LOW (ref 34.0–46.6)
Hemoglobin: 9.1 g/dL — ABNORMAL LOW (ref 11.1–15.9)
Immature Grans (Abs): 0 10*3/uL (ref 0.0–0.1)
Immature Granulocytes: 0 %
Lymphocytes Absolute: 1.9 10*3/uL (ref 0.7–3.1)
Lymphs: 19 %
MCH: 20.6 pg — ABNORMAL LOW (ref 26.6–33.0)
MCHC: 29.6 g/dL — ABNORMAL LOW (ref 31.5–35.7)
MCV: 70 fL — ABNORMAL LOW (ref 79–97)
Monocytes Absolute: 0.8 10*3/uL (ref 0.1–0.9)
Monocytes: 8 %
Neutrophils Absolute: 6.8 10*3/uL (ref 1.4–7.0)
Neutrophils: 65 %
Platelets: 432 10*3/uL (ref 150–450)
RBC: 4.42 x10E6/uL (ref 3.77–5.28)
RDW: 17.2 % — ABNORMAL HIGH (ref 11.7–15.4)
WBC: 10.3 10*3/uL (ref 3.4–10.8)

## 2022-06-14 LAB — COMPREHENSIVE METABOLIC PANEL
ALT: 12 IU/L (ref 0–32)
AST: 10 IU/L (ref 0–40)
Albumin/Globulin Ratio: 1.6 (ref 1.2–2.2)
Albumin: 3.5 g/dL — ABNORMAL LOW (ref 3.9–4.9)
Alkaline Phosphatase: 59 IU/L (ref 44–121)
BUN/Creatinine Ratio: 13 (ref 9–23)
BUN: 11 mg/dL (ref 6–20)
Bilirubin Total: <0.2 mg/dL (ref 0.0–1.2)
CO2: 22 mmol/L (ref 20–29)
Calcium: 8.5 mg/dL — ABNORMAL LOW (ref 8.7–10.2)
Chloride: 103 mmol/L (ref 96–106)
Creatinine, Ser: 0.85 mg/dL (ref 0.57–1.00)
Globulin, Total: 2.2 g/dL (ref 1.5–4.5)
Glucose: 91 mg/dL (ref 70–99)
Potassium: 4.6 mmol/L (ref 3.5–5.2)
Sodium: 138 mmol/L (ref 134–144)
Total Protein: 5.7 g/dL — ABNORMAL LOW (ref 6.0–8.5)
eGFR: 90 mL/min/{1.73_m2} (ref >59)

## 2022-06-14 LAB — IRON,TIBC AND FERRITIN PANEL
Ferritin: 11 ng/mL — ABNORMAL LOW (ref 15–150)
Iron Saturation: 4 % — CL (ref 15–55)
Iron: 15 ug/dL — ABNORMAL LOW (ref 27–159)
Total Iron Binding Capacity: 381 ug/dL (ref 250–450)
UIBC: 366 ug/dL (ref 131–425)

## 2022-06-14 LAB — B12 AND FOLATE PANEL
Folate: 17.5 ng/mL (ref >3.0)
Vitamin B-12: 689 pg/mL (ref 232–1245)

## 2022-06-14 LAB — LIPID PANEL WITH LDL/HDL RATIO
Cholesterol, Total: 156 mg/dL (ref 100–199)
HDL: 32 mg/dL — ABNORMAL LOW (ref >39)
LDL Chol Calc (NIH): 102 mg/dL — ABNORMAL HIGH (ref 0–99)
LDL/HDL Ratio: 3.2 ratio (ref 0.0–3.2)
Triglycerides: 122 mg/dL (ref 0–149)
VLDL Cholesterol Cal: 22 mg/dL (ref 5–40)

## 2022-06-14 LAB — TSH+FREE T4
Free T4: 1.11 ng/dL (ref 0.82–1.77)
TSH: 1.13 u[IU]/mL (ref 0.450–4.500)

## 2022-06-14 LAB — VITAMIN D 25 HYDROXY (VIT D DEFICIENCY, FRACTURES): Vit D, 25-Hydroxy: 25.4 ng/mL — ABNORMAL LOW (ref 30.0–100.0)

## 2022-06-15 LAB — NUSWAB VAGINITIS PLUS (VG+)
Candida albicans, NAA: NEGATIVE
Candida glabrata, NAA: NEGATIVE
Chlamydia trachomatis, NAA: NEGATIVE
Neisseria gonorrhoeae, NAA: NEGATIVE
Trich vag by NAA: NEGATIVE

## 2022-06-16 ENCOUNTER — Telehealth: Payer: Self-pay

## 2022-06-16 ENCOUNTER — Other Ambulatory Visit: Payer: Self-pay

## 2022-06-16 MED ORDER — FERROUS SULFATE 325 (65 FE) MG PO TABS: 325.0000 mg | ORAL_TABLET | Freq: Every day | ORAL | 5 refills | Status: DC

## 2022-06-16 NOTE — Telephone Encounter (Signed)
-----   Message from Mylinda Latina, PA-C sent at 06/16/2022  1:33 PM EDT ----- Please let her know that her iron is very low and she needs to supplement daily. Her calcium and vit D are also low and should supplement. Her hemoglobin did drop some again, but not nearly as low as it has in the past. I would advise that she follow up with hematology based on these results as well as the gynecologist as we discussed during her visit (referral already placed). Finally her cholesterol is just slightly elevated and continue to work on diet and exercise

## 2022-06-16 NOTE — Telephone Encounter (Signed)
Pt notified for labs and send ferrous sulphate

## 2022-06-17 LAB — URINE CULTURE, REFLEX

## 2022-06-17 LAB — MICROSCOPIC EXAMINATION
Casts: NONE SEEN /lpf
RBC, Urine: NONE SEEN /hpf (ref 0–2)

## 2022-06-17 LAB — UA/M W/RFLX CULTURE, ROUTINE
Bilirubin, UA: NEGATIVE
Glucose, UA: NEGATIVE
Ketones, UA: NEGATIVE
Leukocytes,UA: NEGATIVE
Nitrite, UA: NEGATIVE
Protein,UA: NEGATIVE
RBC, UA: NEGATIVE
Specific Gravity, UA: 1.02 (ref 1.005–1.030)
Urobilinogen, Ur: 0.2 mg/dL (ref 0.2–1.0)
pH, UA: 6.5 (ref 5.0–7.5)

## 2022-06-30 ENCOUNTER — Other Ambulatory Visit: Payer: Self-pay | Admitting: Physician Assistant

## 2022-06-30 ENCOUNTER — Telehealth: Payer: Self-pay | Admitting: Physician Assistant

## 2022-06-30 DIAGNOSIS — E669 Obesity, unspecified: Secondary | ICD-10-CM

## 2022-07-01 ENCOUNTER — Telehealth: Payer: Self-pay | Admitting: Physician Assistant

## 2022-07-01 NOTE — Telephone Encounter (Signed)
Per patent's request, Weight Mgmt referral faxed to [facility]; [phone]

## 2022-07-07 ENCOUNTER — Telehealth: Payer: Self-pay | Admitting: Physician Assistant

## 2022-07-07 NOTE — Telephone Encounter (Signed)
Weight Mgmt referral faxed to [facility]; [phone]

## 2022-07-08 ENCOUNTER — Telehealth: Payer: Self-pay | Admitting: Physician Assistant

## 2022-07-10 NOTE — Telephone Encounter (Signed)
Error

## 2022-07-29 IMAGING — US US PELVIS COMPLETE
1 series · 13 of 25 positions shown · non-contrast
Comparison: None.

CLINICAL DATA: Anemia.  Irregular menses.  Tubal ligation.

EXAM:
TRANSABDOMINAL AND TRANSVAGINAL ULTRASOUND OF PELVIS
DOPPLER ULTRASOUND OF OVARIES
TECHNIQUE: Both transabdominal and transvaginal ultrasound examinations of the
pelvis were performed. Transabdominal technique was performed for
global imaging of the pelvis including uterus, ovaries, adnexal
regions, and pelvic cul-de-sac.
It was necessary to proceed with endovaginal exam following the
transabdominal exam to visualize the endometrium and ovaries. Color
and duplex Doppler ultrasound was utilized to evaluate blood flow to
the ovaries.

[Series 1: us pelvis (transabdominal only) · 13 of 132 slices shown]
[im 1/132]
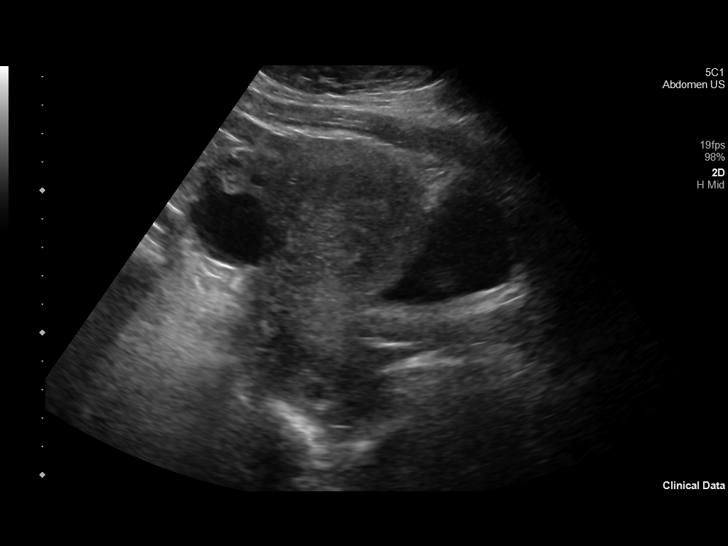
[im 11/132]
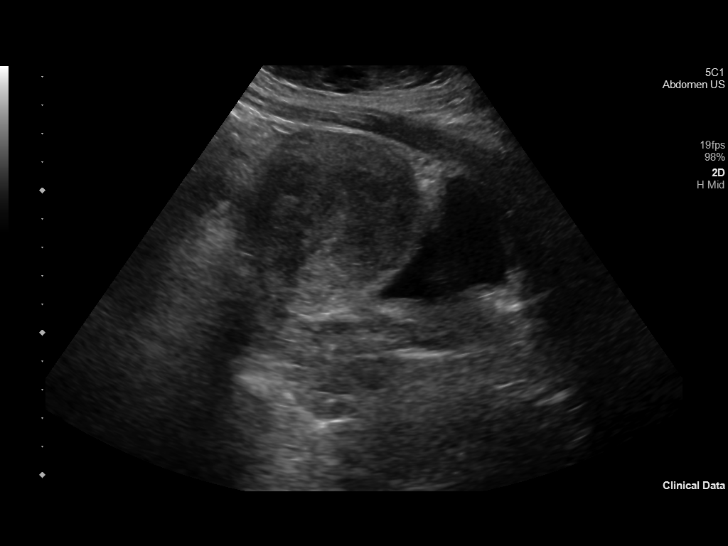
[im 22/132]
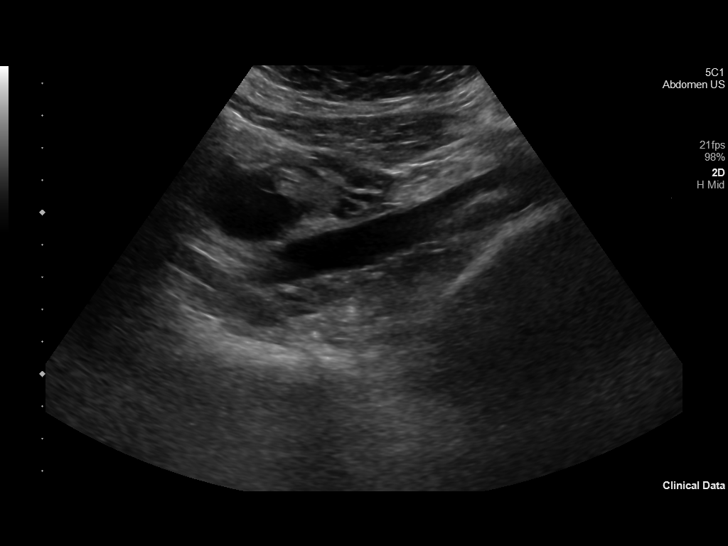
[im 33/132]
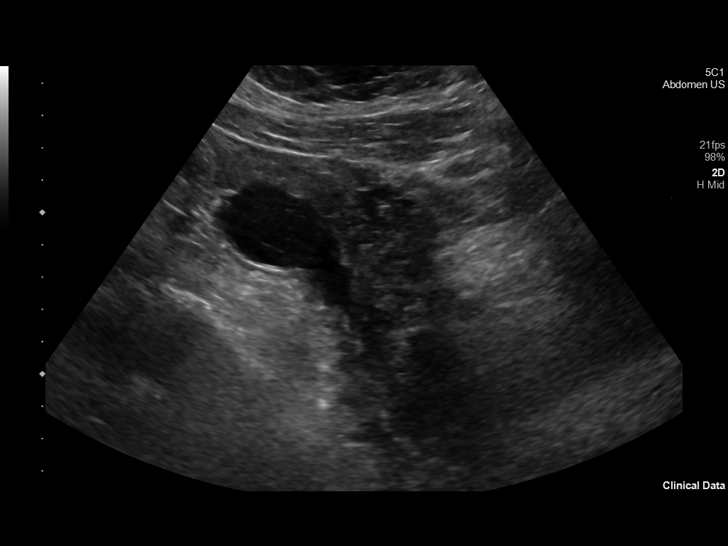
[im 44/132]
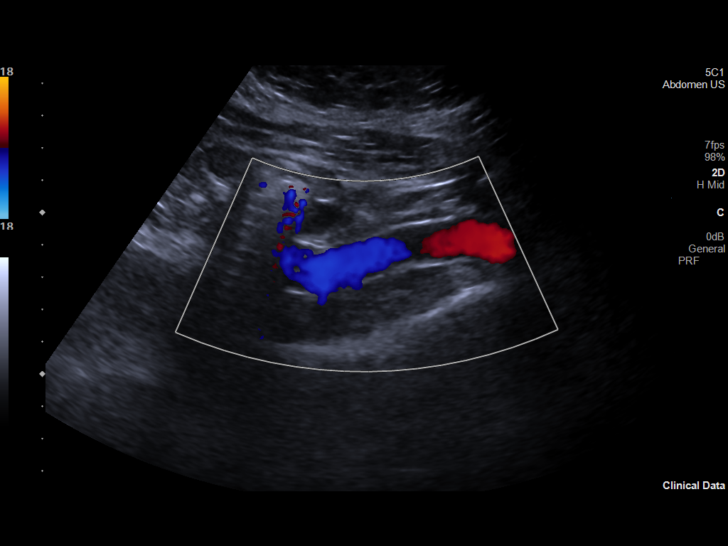
[im 55/132]
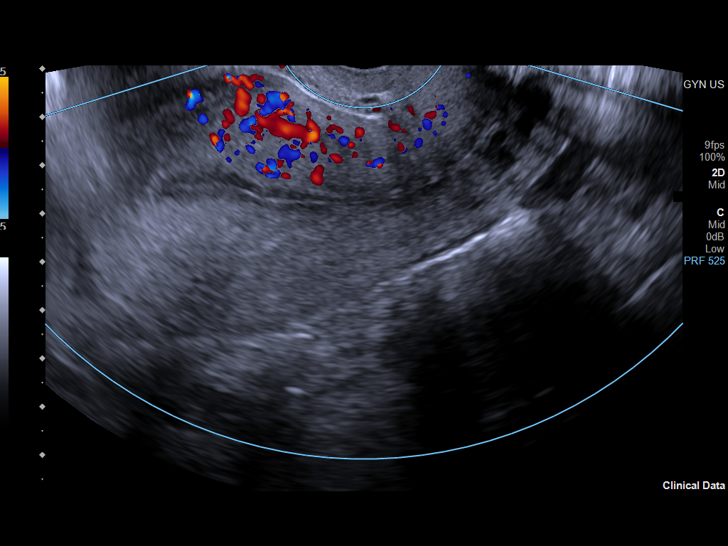
[im 66/132]
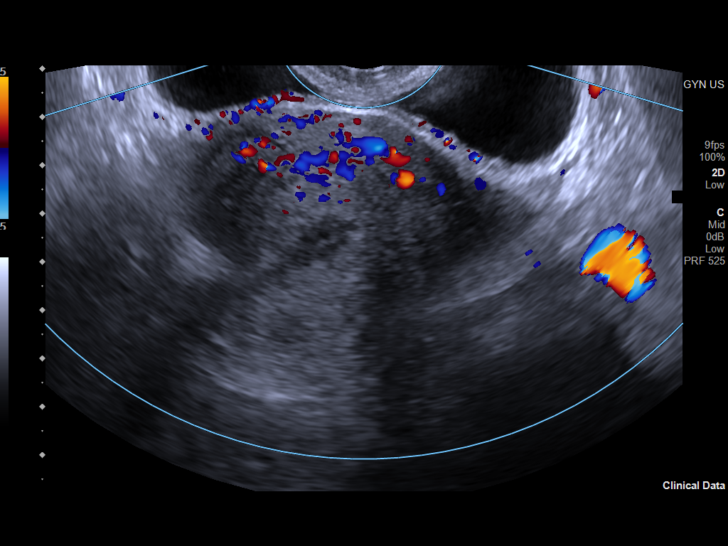
[im 77/132]
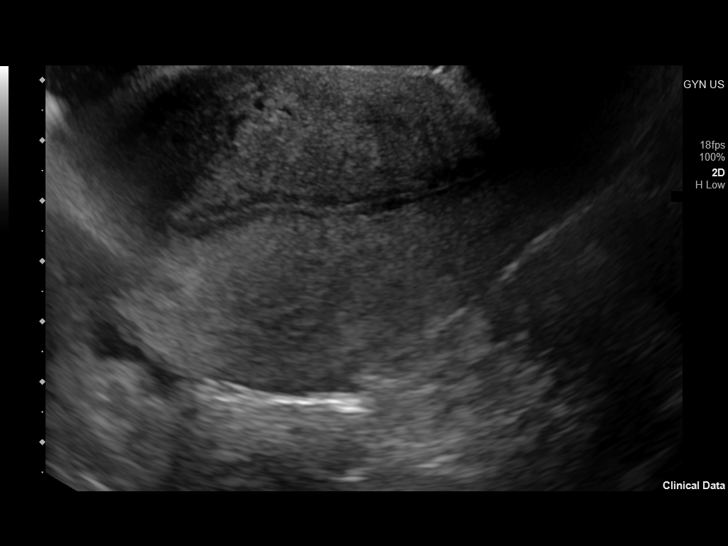
[im 88/132]
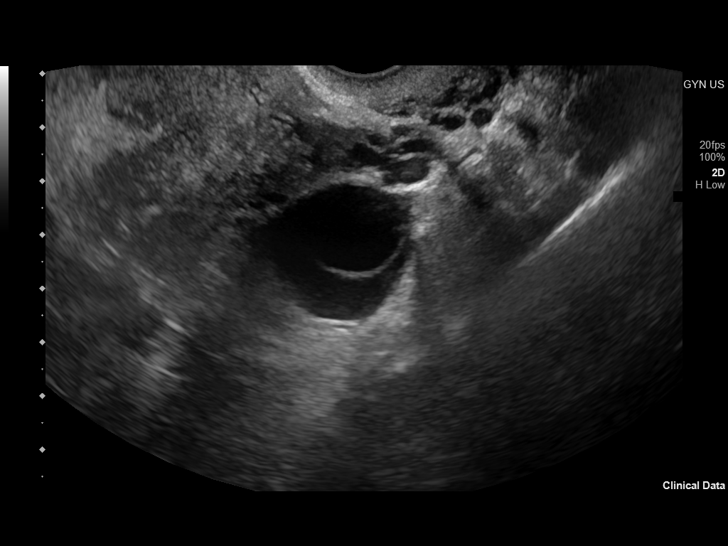
[im 99/132]
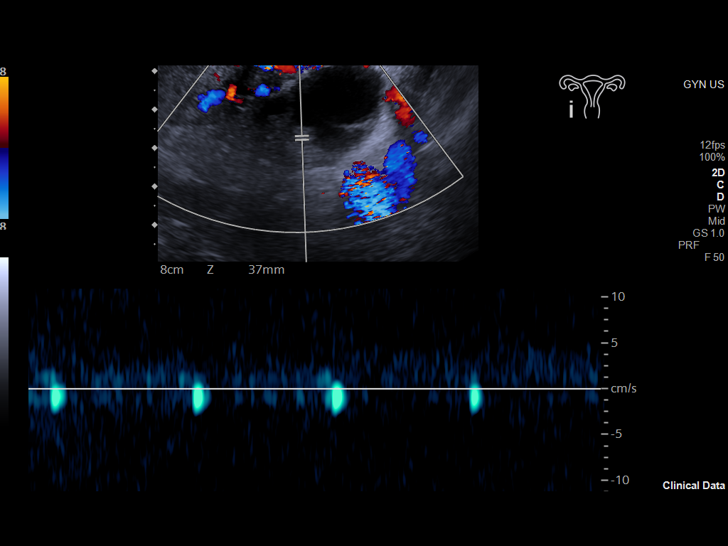
[im 110/132]
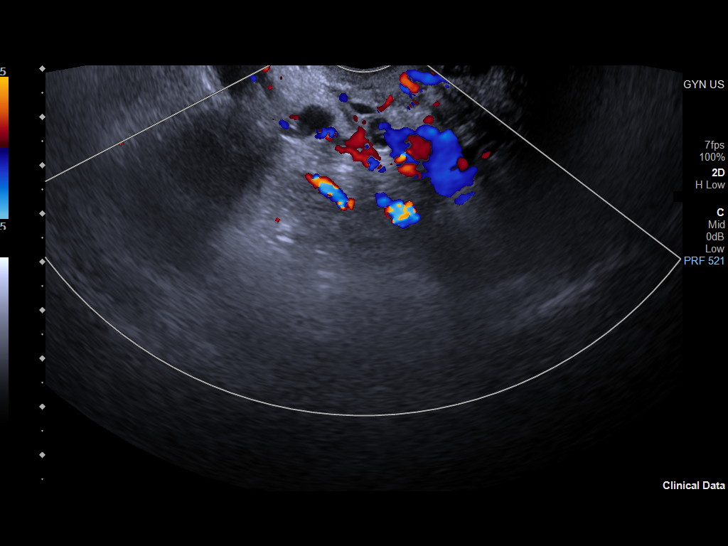
[im 121/132]
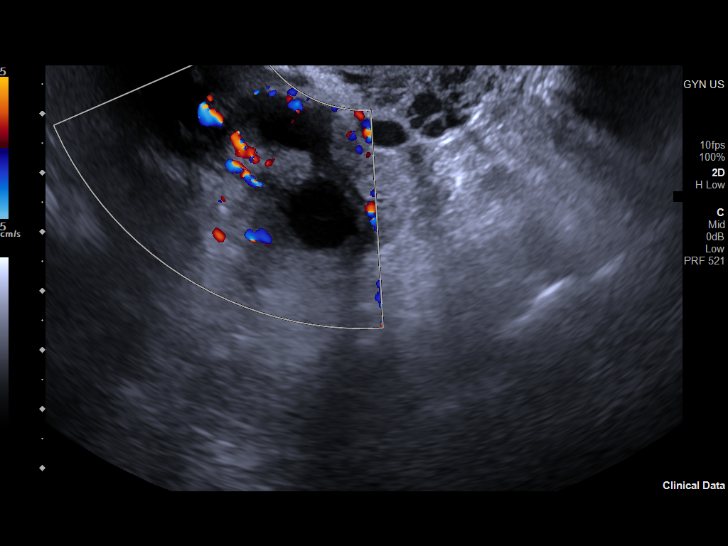
[im 132/132]
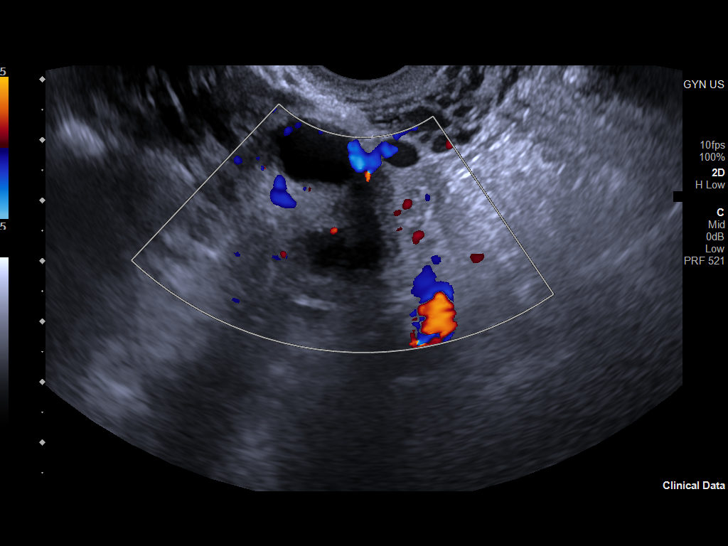

[13 of 25 positions shown; findings below may reference images not displayed]

FINDINGS: Uterus

Measurements: 11.1 x 6.2 x 7.2 cm = volume: 257 mL. Contains a
cm hypoechoic region posteriorly in the fundus consistent with a
fibroid.

Endometrium

Thickness: 6 mm.  No focal abnormality visualized.

Right ovary

Measurements: 3.9 x 2.7 x 3.0 cm = volume: 16.4 mL. Contains a small
cyst with a single thin septation measuring 2.8 x 2.7 x 2.5 cm.

Left ovary

Measurements: 3.0 x 2.0 x 2.7 cm = volume: 8.4 mL. Normal
appearance/no adnexal mass.

Pulsed Doppler evaluation of both ovaries demonstrates normal
low-resistance arterial and venous waveforms.

Other findings

No abnormal free fluid.
IMPRESSION: 1. There is a mildly complicated cyst with a single thin septation
in the right ovary measuring 2.8 cm of no significance. No follow-up
necessary.
2. 1.9 cm fibroid in the uterus.
3. The uterus, endometrium, and ovaries are otherwise normal. No
evidence of torsion.

## 2022-08-14 ENCOUNTER — Encounter: Payer: BC Managed Care – PPO | Admitting: Obstetrics and Gynecology

## 2022-09-10 NOTE — Progress Notes (Deleted)
McDonough, Si Gaul, PA-C   No chief complaint on file.   HPI:      [name] is a 36 y.o. [id number] whose LMP was No LMP recorded., presents today for NP eval of IDA/ menorrhagia, referrd by PCP  H/H=9.1/30.7 9/23  Neg pap 6/22.  Patient Active Problem List   Diagnosis Date Noted   Iron deficiency anemia due to chronic blood loss 10/23/2021   Trichomonas vaginalis infection 06/24/2016   Chronic fatigue 06/12/2016   Obesity, Class III, BMI 40-49.9 (morbid obesity) 248 lbs 06/12/2016   Dysmenorrhea 06/12/2016   Status post tubal ligation 2009 06/12/2016   Potential exposure to STD 06/12/2016    Past Surgical History:  Procedure Laterality Date   TUBAL LIGATION      Family History  Problem Relation Age of Onset   Heart disease Mother    Stroke Maternal Grandmother    Heart disease Maternal Grandmother    Breast cancer Neg Hx    Ovarian cancer Neg Hx    Colon cancer Neg Hx    Cancer Neg Hx     Social History   Socioeconomic History   Marital status: Single    Spouse name: Not on file   Number of children: Not on file   Years of education: Not on file   Highest education level: Not on file  Occupational History   Not on file  Tobacco Use   Smoking status: Never   Smokeless tobacco: Never  Vaping Use   Vaping Use: Never used  Substance and Sexual Activity   Alcohol use: Yes    Alcohol/week: 2.0 standard drinks of alcohol    Types: 2 Standard drinks or equivalent per week    Comment: last use 07/06/21 (special occasions)   Drug use: Not Currently    Types: Marijuana    Comment: last use age 73   Sexual activity: Yes    Partners: Male    Birth control/protection: Surgical  Other Topics Concern   Not on file  Social History Narrative   Not on file   Social Determinants of Health   Financial Resource Strain: Not on file  Food Insecurity: Not on file  Transportation Needs: Not on file  Physical Activity: Not on file  Stress: Not on file   Social Connections: Not on file  Intimate Partner Violence: Not on file    Outpatient Medications Prior to Visit  Medication Sig Dispense Refill   ferrous sulfate 325 (65 FE) MG tablet Take 1 tablet (325 mg total) by mouth daily with breakfast. 30 tablet 5   fluconazole (DIFLUCAN) 150 MG tablet Take 1 tablet by mouth once and may repeat in 3 days if symptoms persist 3 tablet 0   Multiple Vitamin (MULTIVITAMIN) tablet Take 1 tablet by mouth daily.     nitrofurantoin, macrocrystal-monohydrate, (MACROBID) 100 MG capsule Take 1 cap twice per day for 10 days. 20 capsule 0   Facility-Administered Medications Prior to Visit  Medication Dose Route Frequency Provider Last Rate Last Admin   cyanocobalamin ((VITAMIN B-12)) injection 1,000 mcg  1,000 mcg Intramuscular Once McDonough, Lauren K, PA-C          ROS:  Review of Systems BREAST: No symptoms   OBJECTIVE:   Vitals:  There were no vitals taken for this visit.  Physical Exam  Results: No results found for this or any previous visit (from the past 24 hour(s)).   Assessment/Plan: No diagnosis found.    No orders  of the defined types were placed in this encounter.     No follow-ups on file.  Theadore Blunck B. Faryal Marxen, PA-C 09/10/2022 8:54 PM

## 2022-09-11 ENCOUNTER — Encounter: Payer: BC Managed Care – PPO | Admitting: Obstetrics and Gynecology

## 2022-12-05 ENCOUNTER — Ambulatory Visit: Payer: BC Managed Care – PPO | Admitting: Physician Assistant

## 2022-12-12 ENCOUNTER — Ambulatory Visit: Payer: BC Managed Care – PPO | Admitting: Physician Assistant

## 2022-12-15 ENCOUNTER — Ambulatory Visit (INDEPENDENT_AMBULATORY_CARE_PROVIDER_SITE_OTHER): Payer: BC Managed Care – PPO | Admitting: Physician Assistant

## 2022-12-15 ENCOUNTER — Encounter: Payer: Self-pay | Admitting: Physician Assistant

## 2022-12-15 VITALS — BP 120/80 | HR 94 | Temp 97.8°F | Resp 16 | Ht 68.0 in | Wt 265.0 lb

## 2022-12-15 DIAGNOSIS — Z113 Encounter for screening for infections with a predominantly sexual mode of transmission: Secondary | ICD-10-CM | POA: Diagnosis not present

## 2022-12-15 DIAGNOSIS — N921 Excessive and frequent menstruation with irregular cycle: Secondary | ICD-10-CM

## 2022-12-15 DIAGNOSIS — E538 Deficiency of other specified B group vitamins: Secondary | ICD-10-CM | POA: Diagnosis not present

## 2022-12-15 DIAGNOSIS — R300 Dysuria: Secondary | ICD-10-CM

## 2022-12-15 DIAGNOSIS — D500 Iron deficiency anemia secondary to blood loss (chronic): Secondary | ICD-10-CM | POA: Diagnosis not present

## 2022-12-15 DIAGNOSIS — R5383 Other fatigue: Secondary | ICD-10-CM

## 2022-12-15 LAB — POCT URINALYSIS DIPSTICK
Bilirubin, UA: NEGATIVE
Blood, UA: NEGATIVE
Ketones, UA: NEGATIVE
Leukocytes, UA: NEGATIVE
Nitrite, UA: NEGATIVE
Protein, UA: NEGATIVE
Spec Grav, UA: 1.01 (ref 1.010–1.025)
Urobilinogen, UA: 0.2 E.U./dL
pH, UA: 6 (ref 5.0–8.0)

## 2022-12-15 MED ORDER — CYANOCOBALAMIN 1000 MCG/ML IJ SOLN
1000.0000 ug | Freq: Once | INTRAMUSCULAR | Status: AC
Start: 2022-12-15 — End: 2022-12-15
  Administered 2022-12-15: 1000 ug via INTRAMUSCULAR

## 2022-12-15 NOTE — Progress Notes (Signed)
[facility] [address] [place], [place] [address]  Internal MEDICINE  Office Visit Note  Patient Name: Sherry Mack  [id number]  [id number]  Date of Service: 12/24/2022  Chief Complaint  Patient presents with   Follow-up   Urinary Tract Infection    Itching     HPI Pt is here for routine follow up -Some vaginal discharge, itching, no odor, would like to test urine/swab -Taking iron tablet -Making an appointment to see GYN, does have heavy menses which likely contribute to hx of anemia. -Not feeling as tired, but has not been to hematology for repeat labs. Will order today.  -had tried phentermine with [facility], did not like it and stopped it. They had mentioned injections as well but insurance coverage may be an issue. Will start with exercising again  Current Medication: Outpatient Encounter Medications as of 12/15/2022  Medication Sig   fluconazole (DIFLUCAN) 150 MG tablet Take 1 tablet by mouth once and may repeat in 3 days if symptoms persist   Multiple Vitamin (MULTIVITAMIN) tablet Take 1 tablet by mouth daily.   nitrofurantoin, macrocrystal-monohydrate, (MACROBID) 100 MG capsule Take 1 cap twice per day for 10 days.   [DISCONTINUED] ferrous sulfate 325 (65 FE) MG tablet Take 1 tablet (325 mg total) by mouth daily with breakfast.   Facility-Administered Encounter Medications as of 12/15/2022  Medication   cyanocobalamin ((VITAMIN B-12)) injection 1,000 mcg   [COMPLETED] cyanocobalamin (VITAMIN B12) injection 1,000 mcg    Surgical History: Past Surgical History:  Procedure Laterality Date   TUBAL LIGATION      Medical History: Past Medical History:  Diagnosis Date   Iron deficiency anemia due to chronic blood loss 10/23/2021   STD (sexually transmitted disease)    20+ years ago- trich in pap 06/12/2016    Family History: Family History  Problem Relation Age of Onset   Heart disease Mother    Stroke Maternal Grandmother    Heart  disease Maternal Grandmother    Breast cancer Neg Hx    Ovarian cancer Neg Hx    Colon cancer Neg Hx    Cancer Neg Hx     Social History   Socioeconomic History   Marital status: Single    Spouse name: Not on file   Number of children: Not on file   Years of education: Not on file   Highest education level: Not on file  Occupational History   Not on file  Tobacco Use   Smoking status: Never   Smokeless tobacco: Never  Vaping Use   Vaping Use: Never used  Substance and Sexual Activity   Alcohol use: Yes    Alcohol/week: 2.0 standard drinks of alcohol    Types: 2 Standard drinks or equivalent per week    Comment: last use 07/06/21 (special occasions)   Drug use: Not Currently    Types: Marijuana    Comment: last use age 19   Sexual activity: Yes    Partners: Male    Birth control/protection: Surgical  Other Topics Concern   Not on file  Social History Narrative   Not on file   Social Determinants of Health   Financial Resource Strain: Not on file  Food Insecurity: Not on file  Transportation Needs: Not on file  Physical Activity: Not on file  Stress: Not on file  Social Connections: Not on file  Intimate Partner Violence: Not on file      Review of Systems  Constitutional:  Negative for chills, fatigue and unexpected weight change.  HENT:  Negative for congestion, postnasal drip, rhinorrhea, sneezing and sore throat.   Eyes:  Negative for redness.  Respiratory:  Negative for cough, chest tightness and shortness of breath.   Cardiovascular:  Negative for chest pain and palpitations.  Gastrointestinal:  Negative for abdominal pain, constipation, diarrhea, nausea and vomiting.  Genitourinary:  Positive for menstrual problem. Negative for dysuria and frequency.  Musculoskeletal:  Negative for arthralgias, back pain, joint swelling and neck pain.  Skin:  Negative for rash.  Neurological: Negative.  Negative for tremors and numbness.  Hematological:  Negative for  adenopathy. Does not bruise/bleed easily.  Psychiatric/Behavioral:  Negative for behavioral problems (Depression), sleep disturbance and suicidal ideas. The patient is not nervous/anxious.     Vital Signs: BP 120/80   Pulse 94   Temp 97.8 F (36.6 C)   Resp 16   Ht  (1.727 m)   Wt 265 lb (120.2 kg)   SpO2 98%   BMI 40.29 kg/m    Physical Exam Vitals and nursing note reviewed.  Constitutional:      General: She is not in acute distress.    Appearance: She is well-developed. She is obese. She is not diaphoretic.  HENT:     Head: Normocephalic and atraumatic.     Mouth/Throat:     Pharynx: No oropharyngeal exudate.  Eyes:     Pupils: Pupils are equal, round, and reactive to light.  Neck:     Thyroid: No thyromegaly.     Vascular: No JVD.     Trachea: No tracheal deviation.  Cardiovascular:     Rate and Rhythm: Normal rate and regular rhythm.     Heart sounds: Normal heart sounds. No murmur heard.    No friction rub. No gallop.  Pulmonary:     Effort: Pulmonary effort is normal. No respiratory distress.     Breath sounds: No wheezing or rales.  Chest:     Chest wall: No tenderness.  Abdominal:     General: Bowel sounds are normal.     Palpations: Abdomen is soft.  Musculoskeletal:        General: Normal range of motion.     Cervical back: Normal range of motion and neck supple.  Lymphadenopathy:     Cervical: No cervical adenopathy.  Skin:    General: Skin is warm and dry.  Neurological:     Mental Status: She is alert and oriented to person, place, and time.     Cranial Nerves: No cranial nerve deficit.  Psychiatric:        Behavior: Behavior normal.        Thought Content: Thought content normal.        Judgment: Judgment normal.        Assessment/Plan: 1. Iron deficiency anemia due to chronic blood loss Will update labs - Fe+TIBC+Fer - CBC w/Diff/Platelet  2. Menorrhagia with irregular cycle Will follow up with OBGYN  3. Screen for STD  (sexually transmitted disease) Pt performed self swab and will notify of results - NuSwab Vaginitis Plus (VG+)  4. Low calcium levels - Comprehensive metabolic panel  5. B12 deficiency - cyanocobalamin (VITAMIN B12) injection 1,000 mcg  6. Other fatigue - CBC w/Diff/Platelet - Fe+TIBC+Fer - Comprehensive metabolic panel  7. Dysuria - POCT Urinalysis Dipstick   General Counseling: Danaysha verbalizes understanding of the findings of todays visit and agrees with plan of treatment. I have discussed any further diagnostic evaluation that  may be needed or ordered today. We also reviewed her medications today. she has been encouraged to call the office with any questions or concerns that should arise related to todays visit.    Orders Placed This Encounter  Procedures   NuSwab Vaginitis Plus (VG+)   CBC w/Diff/Platelet   Fe+TIBC+Fer   Comprehensive metabolic panel   POCT Urinalysis Dipstick    Meds ordered this encounter  Medications   cyanocobalamin (VITAMIN B12) injection 1,000 mcg    This patient was seen by Lynn Ito, PA-C in collaboration with Dr. Beverely Risen as a part of collaborative care agreement.   Total time spent:30 Minutes Time spent includes review of chart, medications, test results, and follow up plan with the patient.      Dr Lyndon Code Internal medicine

## 2022-12-17 LAB — NUSWAB VAGINITIS PLUS (VG+)
Candida albicans, NAA: NEGATIVE
Candida glabrata, NAA: NEGATIVE
Chlamydia trachomatis, NAA: NEGATIVE
Neisseria gonorrhoeae, NAA: NEGATIVE
Trich vag by NAA: NEGATIVE

## 2022-12-18 ENCOUNTER — Telehealth: Payer: Self-pay

## 2022-12-18 NOTE — Telephone Encounter (Signed)
Spoke with patient regarding NuSwab results. 

## 2022-12-18 NOTE — Telephone Encounter (Signed)
-----   Message from Mylinda Latina, PA-C sent at 12/17/2022 12:40 PM EDT ----- Please let her know that her swab came back negative for anything

## 2022-12-21 ENCOUNTER — Other Ambulatory Visit: Payer: Self-pay | Admitting: Physician Assistant

## 2023-01-12 ENCOUNTER — Ambulatory Visit: Payer: BC Managed Care – PPO | Admitting: Advanced Practice Midwife

## 2023-01-12 DIAGNOSIS — Z113 Encounter for screening for infections with a predominantly sexual mode of transmission: Secondary | ICD-10-CM

## 2023-01-12 LAB — WET PREP FOR TRICH, YEAST, CLUE
Trichomonas Exam: NEGATIVE
Yeast Exam: NEGATIVE

## 2023-01-12 LAB — HM HIV SCREENING LAB: HM HIV Screening: NEGATIVE

## 2023-01-12 NOTE — Progress Notes (Signed)
Pt is here for STD screening.  Wet mount results reviewed, no treatment required per SO.  Condoms given.  Melva Faux M Deisha Stull, RN  

## 2023-01-12 NOTE — Progress Notes (Signed)
[facility] Department  STI clinic/screening visit [address] [place] [address] [phone]  Subjective:  Sherry Mack is a 37 y.o. SBF nonsmoker G5P3 female being seen today for an STI screening visit. The patient reports they do have symptoms.  Patient reports that they do not desire a pregnancy in the next year.   They reported they are not interested in discussing contraception today.    Patient's last menstrual period was 12/25/2022.  Patient has the following medical conditions:   Patient Active Problem List   Diagnosis Date Noted   Iron deficiency anemia due to chronic blood loss 10/23/2021   Trichomonas vaginalis infection 06/24/2016   Chronic fatigue 06/12/2016   Obesity, Class III, BMI 40-49.9 (morbid obesity) 248 lbs 06/12/2016   Dysmenorrhea 06/12/2016   Status post tubal ligation 2009 06/12/2016   Potential exposure to STD 06/12/2016    Chief Complaint  Patient presents with   SEXUALLY TRANSMITTED DISEASE    Irritation around vagina    HPI  Patient reports vaginal irritation x 4 days. LMP 12/25/22. Last sex 12/17/22 without condom; with current partner x 6 mo; 1 partner in last 3 mo. Last MJ age 74. Last ETOH 11/2022 (2 glasses wine). Last pap 02/21/21 neg HPV neg. BTL 2009.   Does the patient using douching products? No  Last HIV test per patient/review of record was  Lab Results  Component Value Date   HMHIVSCREEN Negative - Validated 11/11/2018    Lab Results  Component Value Date   HIV Non Reactive 06/16/2017   Patient reports last pap was No results found for: "DIAGPAP"  Lab Results  Component Value Date   SPECADGYN Comment 06/12/2016    Screening for MPX risk: Does the patient have an unexplained rash? No Is the patient MSM? No Does the patient endorse multiple sex partners or anonymous sex partners? No Did the patient have close or sexual contact with a person diagnosed with MPX? No Has the patient traveled outside  the [place] where MPX is endemic? No Is there a high clinical suspicion for MPX-- evidenced by one of the following No  -Unlikely to be chickenpox  -Lymphadenopathy  -Rash that present in same phase of evolution on any given body part See flowsheet for further details and programmatic requirements.   Immunization history:  Immunization History  Administered Date(s) Administered   Hepatitis B 06/13/1996, 07/18/1996, 12/19/1996   PPD Test 10/04/2021   Tdap 11/15/2018     The following portions of the patient's history were reviewed and updated as appropriate: allergies, current medications, past medical history, past social history, past surgical history and problem list.  Objective:  There were no vitals filed for this visit.  Physical Exam Vitals and nursing note reviewed.  Constitutional:      Appearance: Normal appearance. She is obese.  HENT:     Head: Normocephalic and atraumatic.     Mouth/Throat:     Mouth: Mucous membranes are moist.     Pharynx: Oropharynx is clear. No oropharyngeal exudate or posterior oropharyngeal erythema.  Eyes:     Conjunctiva/sclera: Conjunctivae normal.  Pulmonary:     Effort: Pulmonary effort is normal.  Abdominal:     Palpations: Abdomen is soft. There is no mass.     Tenderness: There is no abdominal tenderness. There is no rebound.     Comments: Soft without masses or tenderness  Genitourinary:    General: Normal vulva.     Exam position: Lithotomy position.  Pubic Area: No rash or pubic lice.      Labia:        Right: No rash or lesion.        Left: No rash or lesion.      Vagina: Vaginal discharge (white creamy leukorrhea, ph<4.5) present. No erythema, bleeding or lesions.     Cervix: Normal.     Uterus: Normal.      Adnexa: Right adnexa normal and left adnexa normal.     Rectum: Normal.     Comments: pH = <4.5 Lymphadenopathy:     Head:     Right side of head: No preauricular or posterior auricular adenopathy.     Left side of  head: No preauricular or posterior auricular adenopathy.     Cervical: No cervical adenopathy.     Right cervical: No superficial, deep or posterior cervical adenopathy.    Left cervical: No superficial, deep or posterior cervical adenopathy.     Upper Body:     Right upper body: No supraclavicular, axillary or epitrochlear adenopathy.     Left upper body: No supraclavicular, axillary or epitrochlear adenopathy.     Lower Body: No right inguinal adenopathy. No left inguinal adenopathy.  Skin:    General: Skin is warm and dry.     Findings: No rash.  Neurological:     Mental Status: She is alert and oriented to person, place, and time.      Assessment and Plan:  Sherry Mack is a 37 y.o. female presenting to the [facility] Department for STI screening  1. Screening examination for venereal disease Treat wet mount per standing orders Immunization nurse consult  - WET PREP FOR TRICH, YEAST, CLUE - Chlamydia/Gonorrhea [facility] - Syphilis Serology, [facility] - HIV [place] [facility] - Gonococcus culture - Gonococcus culture   Patient accepted all screenings including oral, vaginal CT/GC and bloodwork for HIV/RPR, and wet prep. Patient meets criteria for HepB screening? No. Ordered? no Patient meets criteria for HepC screening? No. Ordered? no  Treat wet prep per standing order Discussed time line for [facility] results and that patient will be called with positive results and encouraged patient to call if she had not heard in 2 weeks.  Counseled to return or seek care for continued or worsening symptoms Recommended repeat testing in 3 months with positive results. Recommended condom use with all sex  Patient is currently using [facility] to prevent pregnancy.    No follow-ups on file.  Future Appointments  Date Time Provider Department Center  06/19/2023 10:00 AM McDonough, Salomon Fick, PA-C [facility] None    Alberteen Spindle, CNM

## 2023-01-16 LAB — GONOCOCCUS CULTURE

## 2023-01-17 LAB — GONOCOCCUS CULTURE

## 2023-02-02 ENCOUNTER — Ambulatory Visit: Payer: BC Managed Care – PPO | Admitting: Physician Assistant

## 2023-02-03 ENCOUNTER — Ambulatory Visit (INDEPENDENT_AMBULATORY_CARE_PROVIDER_SITE_OTHER): Payer: BC Managed Care – PPO | Admitting: Nurse Practitioner

## 2023-02-03 ENCOUNTER — Encounter: Payer: Self-pay | Admitting: Nurse Practitioner

## 2023-02-03 VITALS — BP 110/78 | HR 84 | Temp 98.5°F | Resp 16 | Ht 68.0 in | Wt 261.0 lb

## 2023-02-03 DIAGNOSIS — Z113 Encounter for screening for infections with a predominantly sexual mode of transmission: Secondary | ICD-10-CM | POA: Diagnosis not present

## 2023-02-03 DIAGNOSIS — D500 Iron deficiency anemia secondary to blood loss (chronic): Secondary | ICD-10-CM

## 2023-02-03 DIAGNOSIS — N898 Other specified noninflammatory disorders of vagina: Secondary | ICD-10-CM | POA: Diagnosis not present

## 2023-02-03 NOTE — Progress Notes (Signed)
[facility] [address] [place], [place] [address]  Internal MEDICINE  Office Visit Note  Patient Name: Sherry Mack  [id number]  [id number]  Date of Service: 02/03/2023  Chief Complaint  Patient presents with   Acute Visit    Vaginal discharge - thick white discharge      HPI Doloros presents for an acute sick visit for white vaginal discharge with odor, low B12, and anemia --want b12 shot but has not had labs check since September 2023 and the level was normal then.  --want to do self swab nuswab -- having thick white vaginal discharge with slight odor. Most recent nuswab was done on 12/15/22 and was negative. She was then tested a few weeks later on 01/12/23 and all tests were negative for STI or other issues.  --anemia in September 2023 with hemoglobin of 9.1, need to recheck labs      Current Medication:  Outpatient Encounter Medications as of 02/03/2023  Medication Sig   ferrous sulfate 325 (65 FE) MG tablet TAKE 1 TABLET BY MOUTH EVERY DAY WITH BREAKFAST   fluconazole (DIFLUCAN) 150 MG tablet Take 1 tablet by mouth once and may repeat in 3 days if symptoms persist   Multiple Vitamin (MULTIVITAMIN) tablet Take 1 tablet by mouth daily.   nitrofurantoin, macrocrystal-monohydrate, (MACROBID) 100 MG capsule Take 1 cap twice per day for 10 days.   Facility-Administered Encounter Medications as of 02/03/2023  Medication   cyanocobalamin ((VITAMIN B-12)) injection 1,000 mcg      Medical History: Past Medical History:  Diagnosis Date   Iron deficiency anemia due to chronic blood loss 10/23/2021   STD (sexually transmitted disease)    20+ years ago- trich in pap 06/12/2016     Vital Signs: BP 110/78   Pulse 84   Temp 98.5 F (36.9 C)   Resp 16   Ht 5\' 8"  (1.727 m)   Wt 261 lb (118.4 kg)   LMP 12/25/2022   SpO2 99%   BMI 39.68 kg/m    Review of Systems  Constitutional: Negative.  Negative for fatigue.  Respiratory: Negative.  Negative for cough,  chest tightness, shortness of breath and wheezing.   Cardiovascular: Negative.  Negative for chest pain and palpitations.  Gastrointestinal: Negative.   Genitourinary:  Positive for vaginal discharge. Negative for dysuria, frequency, urgency, vaginal bleeding and vaginal pain.  Musculoskeletal: Negative.     Physical Exam Vitals reviewed.  Constitutional:      General: She is not in acute distress.    Appearance: Normal appearance. She is obese. She is not ill-appearing.  HENT:     Head: Normocephalic and atraumatic.  Eyes:     Pupils: Pupils are equal, round, and reactive to light.  Cardiovascular:     Rate and Rhythm: Normal rate and regular rhythm.  Pulmonary:     Effort: Pulmonary effort is normal. No respiratory distress.  Neurological:     Mental Status: She is alert and oriented to person, place, and time.  Psychiatric:        Mood and Affect: Mood normal.        Behavior: Behavior normal.       Assessment/Plan: 1. Vaginal discharge Vaginal swab done by patient, will call with results  - NuSwab Vaginitis Plus (VG+)  2. Iron deficiency anemia due to chronic blood loss Reordered labs, patient will go this week - CBC with Differential/Platelet - CMP14+EGFR - Iron, TIBC and Ferritin Panel - B12 and Folate Panel  3.  Screen for STD (sexually transmitted disease) Swab sent for testing, will call patient with results. - NuSwab Vaginitis Plus (VG+)   General Counseling: Aleyza verbalizes understanding of the findings of todays visit and agrees with plan of treatment. I have discussed any further diagnostic evaluation that may be needed or ordered today. We also reviewed her medications today. she has been encouraged to call the office with any questions or concerns that should arise related to todays visit.    Counseling:    Orders Placed This Encounter  Procedures   CBC with Differential/Platelet   CMP14+EGFR   Iron, TIBC and Ferritin Panel   B12 and Folate  Panel   NuSwab Vaginitis Plus (VG+)    No orders of the defined types were placed in this encounter.   Return if symptoms worsen or fail to improve.  [place] Controlled Substance Database was reviewed by me for overdose risk score (ORS)  Time spent:30 Minutes Time spent with patient included reviewing progress notes, labs, imaging studies, and discussing plan for follow up.   This patient was seen by Sallyanne Kuster, FNP-C in collaboration with Dr. Beverely Risen as a part of collaborative care agreement.  Medard Decuir R. Tedd Sias, MSN, FNP-C Internal Medicine

## 2023-02-05 LAB — NUSWAB VAGINITIS PLUS (VG+): Neisseria gonorrhoeae, NAA: NEGATIVE

## 2023-02-06 LAB — NUSWAB VAGINITIS PLUS (VG+)
Atopobium vaginae: HIGH Score — AB
Candida glabrata, NAA: NEGATIVE
Chlamydia trachomatis, NAA: NEGATIVE
Trich vag by NAA: NEGATIVE

## 2023-02-10 ENCOUNTER — Telehealth: Payer: Self-pay

## 2023-02-10 ENCOUNTER — Other Ambulatory Visit: Payer: Self-pay | Admitting: Nurse Practitioner

## 2023-02-10 MED ORDER — METRONIDAZOLE 500 MG PO TABS: 500.0000 mg | ORAL_TABLET | Freq: Two times a day (BID) | ORAL | 0 refills | Status: AC

## 2023-02-10 NOTE — Telephone Encounter (Signed)
-----   Message from Sallyanne Kuster, NP sent at 02/10/2023  8:36 AM EDT ----- Please let patient know that the nuswab showed bacterial vaginosis. I have sent a prescription for metronidazole, do not drink alcohol while taking this medication.  She was negative for yeast, trich, chlamydia and gonorrhea.

## 2023-02-10 NOTE — Progress Notes (Signed)
Please let patient know that the nuswab showed bacterial vaginosis. I have sent a prescription for metronidazole, do not drink alcohol while taking this medication.  She was negative for yeast, trich, chlamydia and gonorrhea.

## 2023-02-10 NOTE — Telephone Encounter (Signed)
Patient called back and gave her, her nuswab results.

## 2023-02-25 ENCOUNTER — Telehealth: Payer: Self-pay

## 2023-02-25 ENCOUNTER — Other Ambulatory Visit: Payer: Self-pay | Admitting: Physician Assistant

## 2023-02-25 MED ORDER — METRONIDAZOLE 500 MG PO TABS: 500.0000 mg | ORAL_TABLET | Freq: Two times a day (BID) | ORAL | 0 refills | Status: DC

## 2023-02-25 MED ORDER — FLUCONAZOLE 150 MG PO TABS: ORAL_TABLET | ORAL | 0 refills | Status: DC

## 2023-02-25 NOTE — Telephone Encounter (Signed)
Done

## 2023-05-21 ENCOUNTER — Ambulatory Visit (INDEPENDENT_AMBULATORY_CARE_PROVIDER_SITE_OTHER): Payer: BC Managed Care – PPO | Admitting: Physician Assistant

## 2023-05-21 ENCOUNTER — Encounter: Payer: Self-pay | Admitting: Physician Assistant

## 2023-05-21 VITALS — BP 112/70 | HR 100 | Temp 98.3°F | Resp 16 | Ht 68.0 in | Wt 263.8 lb

## 2023-05-21 DIAGNOSIS — R300 Dysuria: Secondary | ICD-10-CM | POA: Diagnosis not present

## 2023-05-21 DIAGNOSIS — H579 Unspecified disorder of eye and adnexa: Secondary | ICD-10-CM | POA: Diagnosis not present

## 2023-05-21 DIAGNOSIS — Z113 Encounter for screening for infections with a predominantly sexual mode of transmission: Secondary | ICD-10-CM

## 2023-05-21 DIAGNOSIS — N898 Other specified noninflammatory disorders of vagina: Secondary | ICD-10-CM | POA: Diagnosis not present

## 2023-05-21 LAB — POCT URINALYSIS DIPSTICK
Appearance: NEGATIVE
Bilirubin, UA: NEGATIVE
Blood, UA: NEGATIVE
Glucose, UA: NEGATIVE
Ketones, UA: POSITIVE
Leukocytes, UA: NEGATIVE
Nitrite, UA: NEGATIVE
Protein, UA: NEGATIVE
Spec Grav, UA: 1.01 (ref 1.010–1.025)
Urobilinogen, UA: 0.2 U/dL
pH, UA: 5 (ref 5.0–8.0)

## 2023-05-21 NOTE — Progress Notes (Signed)
[facility] [address] [place], [place] [address]  Internal MEDICINE  Office Visit Note  Patient Name: Sherry Mack  [id number]  [id number]  Date of Service: 05/21/2023  Chief Complaint  Patient presents with   Acute Visit     HPI Pt is here for a sick visit for STI testing -Vaginal irritation recently, some white discharge, a little discomfort with urination. No pelvic pain or back pain. No urgency or frequency -Unprotected sex 1 month ago, no known STI exposure, but would like to check a vaginal swab as well as blood work -a little eye irritation as well, just a little itchy with some discharge in corner. Right eye worse than left. Will try some systane drops to help rinse out irritation, no redness or signs of infection on exam but may call if worsening  Current Medication:  Outpatient Encounter Medications as of 05/21/2023  Medication Sig   ferrous sulfate 325 (65 FE) MG tablet TAKE 1 TABLET BY MOUTH EVERY DAY WITH BREAKFAST   fluconazole (DIFLUCAN) 150 MG tablet Take 1 tablet by mouth once and may repeat in 3 days if symptoms persist   metroNIDAZOLE (FLAGYL) 500 MG tablet Take 1 tablet (500 mg total) by mouth 2 (two) times daily.   Multiple Vitamin (MULTIVITAMIN) tablet Take 1 tablet by mouth daily.   nitrofurantoin, macrocrystal-monohydrate, (MACROBID) 100 MG capsule Take 1 cap twice per day for 10 days.   Facility-Administered Encounter Medications as of 05/21/2023  Medication   cyanocobalamin ((VITAMIN B-12)) injection 1,000 mcg      Medical History: Past Medical History:  Diagnosis Date   Iron deficiency anemia due to chronic blood loss 10/23/2021   STD (sexually transmitted disease)    20+ years ago- trich in pap 06/12/2016     Vital Signs: BP 112/70   Pulse 100   Temp 98.3 F (36.8 C)   Resp 16   Ht 5\' 8"  (1.727 m)   Wt 263 lb 12.8 oz (119.7 kg)   SpO2 98%   BMI 40.11 kg/m    Review of Systems  Constitutional:  Negative for fatigue  and fever.  HENT:  Negative for congestion, mouth sores and postnasal drip.   Eyes:  Positive for itching. Negative for pain and redness.  Respiratory:  Negative for cough.   Cardiovascular:  Negative for chest pain.  Gastrointestinal:  Negative for abdominal pain.  Genitourinary:  Positive for vaginal discharge. Negative for flank pain and urgency.  Psychiatric/Behavioral: Negative.      Physical Exam Vitals reviewed.  Constitutional:      General: She is not in acute distress.    Appearance: Normal appearance. She is obese. She is not ill-appearing.  HENT:     Head: Normocephalic and atraumatic.  Eyes:     Extraocular Movements: Extraocular movements intact.     Conjunctiva/sclera: Conjunctivae normal.     Pupils: Pupils are equal, round, and reactive to light.  Cardiovascular:     Rate and Rhythm: Normal rate and regular rhythm.  Pulmonary:     Effort: Pulmonary effort is normal. No respiratory distress.  Neurological:     Mental Status: She is alert and oriented to person, place, and time.  Psychiatric:        Mood and Affect: Mood normal.        Behavior: Behavior normal.       Assessment/Plan: 1. Screening for STDs (sexually transmitted diseases) Will send swab and order blood testing as well - NuSwab Vaginitis Plus (  VG+) - STI Profile  2. Vaginal discharge - NuSwab Vaginitis Plus (VG+)  3. Dysuria - POCT Urinalysis Dipstick negative  4. Itchy eyes May try systane drops first to help with irritation, may be allergy related, if worsening may call   General Counseling: [name] verbalizes understanding of the findings of todays visit and agrees with plan of treatment. I have discussed any further diagnostic evaluation that may be needed or ordered today. We also reviewed her medications today. she has been encouraged to call the office with any questions or concerns that should arise related to todays visit.    Counseling:    Orders Placed This Encounter   Procedures   NuSwab Vaginitis Plus (VG+)   STI Profile   POCT Urinalysis Dipstick    No orders of the defined types were placed in this encounter.   Time spent:30 Minutes

## 2023-05-24 LAB — NUSWAB VAGINITIS PLUS (VG+)
Atopobium vaginae: HIGH {score} — AB
BVAB 2: HIGH {score} — AB
Candida albicans, NAA: POSITIVE — AB
Candida glabrata, NAA: NEGATIVE
Chlamydia trachomatis, NAA: NEGATIVE
Megasphaera 1: HIGH {score} — AB
Neisseria gonorrhoeae, NAA: NEGATIVE
Trich vag by NAA: NEGATIVE

## 2023-05-26 ENCOUNTER — Other Ambulatory Visit: Payer: Self-pay | Admitting: Physician Assistant

## 2023-05-26 ENCOUNTER — Telehealth: Payer: Self-pay

## 2023-05-26 DIAGNOSIS — N760 Acute vaginitis: Secondary | ICD-10-CM

## 2023-05-26 DIAGNOSIS — B3731 Acute candidiasis of vulva and vagina: Secondary | ICD-10-CM

## 2023-05-26 MED ORDER — FLUCONAZOLE 150 MG PO TABS
ORAL_TABLET | ORAL | 0 refills | Status: DC
Start: 2023-05-26 — End: 2023-06-19

## 2023-05-26 MED ORDER — METRONIDAZOLE 500 MG PO TABS
500.0000 mg | ORAL_TABLET | Freq: Two times a day (BID) | ORAL | 0 refills | Status: DC
Start: 2023-05-26 — End: 2023-06-19

## 2023-05-26 NOTE — Telephone Encounter (Signed)
Left message for patient to please call me back regarding her lab results.

## 2023-05-26 NOTE — Telephone Encounter (Signed)
Spoke with patient regarding labs.

## 2023-05-26 NOTE — Telephone Encounter (Signed)
-----   Message from Carlean Jews sent at 05/26/2023  9:52 AM EDT ----- Please let her know that she has BV and yeast, otherwise swab negative. I have sent metronidazole and diflucan for her. Please let her know not to drink alcohol on the metronidazole.

## 2023-06-19 ENCOUNTER — Encounter: Payer: Self-pay | Admitting: Physician Assistant

## 2023-06-19 ENCOUNTER — Ambulatory Visit (INDEPENDENT_AMBULATORY_CARE_PROVIDER_SITE_OTHER): Payer: BC Managed Care – PPO | Admitting: Physician Assistant

## 2023-06-19 VITALS — BP 110/70 | HR 88 | Temp 97.6°F | Resp 16 | Ht 68.0 in | Wt 261.2 lb

## 2023-06-19 DIAGNOSIS — D500 Iron deficiency anemia secondary to blood loss (chronic): Secondary | ICD-10-CM

## 2023-06-19 DIAGNOSIS — E538 Deficiency of other specified B group vitamins: Secondary | ICD-10-CM | POA: Diagnosis not present

## 2023-06-19 DIAGNOSIS — Z0001 Encounter for general adult medical examination with abnormal findings: Secondary | ICD-10-CM | POA: Diagnosis not present

## 2023-06-19 DIAGNOSIS — R5383 Other fatigue: Secondary | ICD-10-CM | POA: Diagnosis not present

## 2023-06-19 DIAGNOSIS — R946 Abnormal results of thyroid function studies: Secondary | ICD-10-CM | POA: Diagnosis not present

## 2023-06-19 DIAGNOSIS — Z113 Encounter for screening for infections with a predominantly sexual mode of transmission: Secondary | ICD-10-CM

## 2023-06-19 DIAGNOSIS — E559 Vitamin D deficiency, unspecified: Secondary | ICD-10-CM

## 2023-06-19 NOTE — Progress Notes (Signed)
[facility] [address] [place], [place] [address]  Internal MEDICINE  Office Visit Note  Patient Name: Sherry Mack  [id number]  [id number]  Date of Service: 06/25/2023  Chief Complaint  Patient presents with   Annual Exam     HPI Pt is here for routine health maintenance examination -Wants to do a vaginal swab, no discharge but would like to screen still -Would also like STI testing via blood work  -Otherwise is doing well without any concerns today - Is due for lab work and reports she has been fasting and would like to go today.  Will need to recheck iron and hemoglobin as these are previously been low and patient has not been following up with hematology recently -BP stable  Current Medication: Outpatient Encounter Medications as of 06/19/2023  Medication Sig   ferrous sulfate 325 (65 FE) MG tablet TAKE 1 TABLET BY MOUTH EVERY DAY WITH BREAKFAST   Multiple Vitamin (MULTIVITAMIN) tablet Take 1 tablet by mouth daily.   [DISCONTINUED] fluconazole (DIFLUCAN) 150 MG tablet Take 1 tablet by mouth once and may repeat in 3 days if symptoms persist   [DISCONTINUED] metroNIDAZOLE (FLAGYL) 500 MG tablet Take 1 tablet (500 mg total) by mouth 2 (two) times daily.   [DISCONTINUED] nitrofurantoin, macrocrystal-monohydrate, (MACROBID) 100 MG capsule Take 1 cap twice per day for 10 days.   Facility-Administered Encounter Medications as of 06/19/2023  Medication   cyanocobalamin ((VITAMIN B-12)) injection 1,000 mcg    Surgical History: Past Surgical History:  Procedure Laterality Date   TUBAL LIGATION      Medical History: Past Medical History:  Diagnosis Date   Iron deficiency anemia due to chronic blood loss 10/23/2021   STD (sexually transmitted disease)    20+ years ago- trich in pap 06/12/2016    Family History: Family History  Problem Relation Age of Onset   Heart disease Mother    Stroke Maternal Grandmother    Heart disease Maternal Grandmother     Breast cancer Neg Hx    Ovarian cancer Neg Hx    Colon cancer Neg Hx    Cancer Neg Hx       Review of Systems  Constitutional:  Negative for chills, fatigue and unexpected weight change.  HENT:  Negative for congestion, postnasal drip, rhinorrhea, sneezing and sore throat.   Eyes:  Negative for redness.  Respiratory:  Negative for cough, chest tightness and shortness of breath.   Cardiovascular:  Negative for chest pain and palpitations.  Gastrointestinal:  Negative for abdominal pain, constipation, diarrhea, nausea and vomiting.  Genitourinary:  Negative for dysuria and frequency.  Musculoskeletal:  Negative for arthralgias, back pain, joint swelling and neck pain.  Skin:  Negative for rash.  Neurological: Negative.  Negative for tremors and numbness.  Hematological:  Negative for adenopathy. Does not bruise/bleed easily.  Psychiatric/Behavioral:  Negative for behavioral problems (Depression), sleep disturbance and suicidal ideas. The patient is not nervous/anxious.      Vital Signs: BP 110/70   Pulse 88   Temp 97.6 F (36.4 C)   Resp 16   Ht 5\' 8"  (1.727 m)   Wt 261 lb 3.2 oz (118.5 kg)   SpO2 98%   BMI 39.72 kg/m    Physical Exam Vitals and nursing note reviewed.  Constitutional:      General: She is not in acute distress.    Appearance: She is well-developed. She is obese. She is not diaphoretic.  HENT:     Head: Normocephalic  and atraumatic.     Mouth/Throat:     Pharynx: No oropharyngeal exudate.  Eyes:     Pupils: Pupils are equal, round, and reactive to light.  Neck:     Thyroid: No thyromegaly.     Vascular: No JVD.     Trachea: No tracheal deviation.  Cardiovascular:     Rate and Rhythm: Normal rate and regular rhythm.     Heart sounds: Normal heart sounds. No murmur heard.    No friction rub. No gallop.  Pulmonary:     Effort: Pulmonary effort is normal. No respiratory distress.     Breath sounds: No wheezing or rales.  Chest:     Chest wall: No  tenderness.  Abdominal:     General: Bowel sounds are normal.     Palpations: Abdomen is soft.     Tenderness: There is no abdominal tenderness.  Musculoskeletal:        General: Normal range of motion.     Cervical back: Normal range of motion and neck supple.  Lymphadenopathy:     Cervical: No cervical adenopathy.  Skin:    General: Skin is warm and dry.  Neurological:     Mental Status: She is alert and oriented to person, place, and time.     Cranial Nerves: No cranial nerve deficit.  Psychiatric:        Behavior: Behavior normal.        Thought Content: Thought content normal.        Judgment: Judgment normal.      LABS: Recent Results (from the past 2160 hour(s))  NuSwab Vaginitis Plus (VG+)     Status: Abnormal   Collection Time: 05/21/23  3:57 PM  Result Value Ref Range   Atopobium vaginae High - 2 (A) Score   BVAB 2 High - 2 (A) Score   Megasphaera 1 High - 2 (A) Score    Comment: Calculate total score by adding the 3 individual bacterial vaginosis (BV) marker scores together.  Total score is interpreted as follows: Total score 0-1: Indicates the absence of BV. Total score   2: Indeterminate for BV. Additional clinical                  data should be evaluated to establish a                  diagnosis. Total score 3-6: Indicates the presence of BV.    Candida albicans, NAA Positive (A) Negative   Candida glabrata, NAA Negative Negative   Trich vag by NAA Negative Negative   Chlamydia trachomatis, NAA Negative Negative   Neisseria gonorrhoeae, NAA Negative Negative  POCT Urinalysis Dipstick     Status: None   Collection Time: 05/21/23  4:02 PM  Result Value Ref Range   Color, UA     Clarity, UA     Glucose, UA Negative Negative   Bilirubin, UA Negative    Ketones, UA Positive    Spec Grav, UA 1.010 1.010 - 1.025   Blood, UA Negative    pH, UA 5.0 5.0 - 8.0   Protein, UA Negative Negative   Urobilinogen, UA 0.2 0.2 or 1.0 E.U./dL   Nitrite, UA Negative     Leukocytes, UA Negative Negative   Appearance Negative    Odor    TSH + free T4     Status: None   Collection Time: 06/19/23 10:52 AM  Result Value Ref Range   TSH 2.060 0.450 -  4.500 uIU/mL   Free T4 1.05 0.82 - 1.77 ng/dL  CBC w/Diff/Platelet     Status: Abnormal   Collection Time: 06/19/23 10:52 AM  Result Value Ref Range   WBC 7.7 3.4 - 10.8 x10E3/uL   RBC 4.10 3.77 - 5.28 x10E6/uL   Hemoglobin 7.8 (L) 11.1 - 15.9 g/dL   Hematocrit 16.1 (L) 09.6 - 46.6 %   MCV 70 (L) 79 - 97 fL   MCH 19.0 (L) 26.6 - 33.0 pg   MCHC 27.1 (L) 31.5 - 35.7 g/dL   RDW 04.5 (H) 40.9 - 81.1 %   Platelets 404 150 - 450 x10E3/uL   Neutrophils 67 Not Estab. %   Lymphs 19 Not Estab. %   Monocytes 8 Not Estab. %   Eos 5 Not Estab. %   Basos 1 Not Estab. %   Neutrophils Absolute 5.1 1.4 - 7.0 x10E3/uL   Lymphocytes Absolute 1.5 0.7 - 3.1 x10E3/uL   Monocytes Absolute 0.6 0.1 - 0.9 x10E3/uL   EOS (ABSOLUTE) 0.4 0.0 - 0.4 x10E3/uL   Basophils Absolute 0.1 0.0 - 0.2 x10E3/uL   Immature Granulocytes 0 Not Estab. %   Immature Grans (Abs) 0.0 0.0 - 0.1 x10E3/uL  Comprehensive metabolic panel     Status: Abnormal   Collection Time: 06/19/23 10:52 AM  Result Value Ref Range   Glucose 101 (H) 70 - 99 mg/dL   BUN 12 6 - 20 mg/dL   Creatinine, Ser 9.14 0.57 - 1.00 mg/dL   eGFR 97 >78 GN/FAO/1.30   BUN/Creatinine Ratio 15 9 - 23   Sodium 140 134 - 144 mmol/L   Potassium 4.7 3.5 - 5.2 mmol/L   Chloride 106 96 - 106 mmol/L   CO2 25 20 - 29 mmol/L   Calcium 8.1 (L) 8.7 - 10.2 mg/dL   Total Protein 5.0 (L) 6.0 - 8.5 g/dL   Albumin 3.1 (L) 3.9 - 4.9 g/dL   Globulin, Total 1.9 1.5 - 4.5 g/dL   Bilirubin Total <8.6 0.0 - 1.2 mg/dL   Alkaline Phosphatase 48 44 - 121 IU/L   AST 13 0 - 40 IU/L   ALT 9 0 - 32 IU/L  Lipid Panel With LDL/HDL Ratio     Status: Abnormal   Collection Time: 06/19/23 10:52 AM  Result Value Ref Range   Cholesterol, Total 159 100 - 199 mg/dL   Triglycerides 578 (H) 0 - 149 mg/dL    HDL 29 (L) >46 mg/dL   VLDL Cholesterol Cal 31 5 - 40 mg/dL   LDL Chol Calc (NIH) 99 0 - 99 mg/dL   LDL/HDL Ratio 3.4 (H) 0.0 - 3.2 ratio    Comment:                                     LDL/HDL Ratio                                             Men  Women                               1/2 Avg.Risk  1.0    1.5  Avg.Risk  3.6    3.2                                2X Avg.Risk  6.2    5.0                                3X Avg.Risk  8.0    6.1   STI Profile     Status: None   Collection Time: 06/19/23 10:52 AM  Result Value Ref Range   Hepatitis B Surface Ag Negative Negative   Hep B Surface Ab, Qual Non Reactive     Comment:              Non Reactive: Not immune to HBV infection.              Equivocal: Unable to determine if anti-HBs                         is present at levels consistent                         with immunity.               Reactive: Anti-HBs concentration detected                         at greater than 10 mIU/mL.                         Individual is considered to be                         immune to infection with HBV.    Hep B Core Total Ab Negative Negative   Rfx to HBc IgM Comment     Comment: Reflex criteria was not met.   Interpretation Comment     Comment:                    HBV Serology Interpretation Chart  -------------------------------------------------------------------  Interpretation             HBsAg  anti-HBs  anti-HBc  anti-HBc IgM  -------------------------------------------------------------------  Key - Analyte present: + Analyte absent: - Test not indicated: TNI  -------------------------------------------------------------------  Susceptible (never  infected and no evidence    -        -         -          TNI  of vaccination)  -------------------------------------------------------------------  Immune due to natural  resolved infection          -        +         +          TNI   -------------------------------------------------------------------  Immune due to vaccination   -        +         -          TNI  -------------------------------------------------------------------  Acute Infection             +        -         +           +  -------------------------------------------------------------------  Chronic infection           +        -         +           -  -------------------------------------------------------------------  Interpretation unclear*     -        -         +          +/-  -------------------------------------------------------------------  *Multiple possibilities: resolved infection (most common); false-   positive anti-HBc (susceptible); "low-level" chronic infection";   resolving acute infection.    HCV Ab Non Reactive Non Reactive   RPR Ser Ql Non Reactive Non Reactive   HIV Screen 4th Generation wRfx Non Reactive Non Reactive    Comment: HIV-1/HIV-2 antibodies and HIV-1 p24 antigen were NOT detected. There is no laboratory evidence of HIV infection. HIV Negative   Fe+TIBC+Fer     Status: Abnormal   Collection Time: 06/19/23 10:52 AM  Result Value Ref Range   Total Iron Binding Capacity 305 250 - 450 ug/dL   UIBC 829 562 - 130 ug/dL   Iron 11 (L) 27 - 865 ug/dL   Iron Saturation 4 (LL) 15 - 55 %   Ferritin 7 (L) 15 - 150 ng/mL  B12 and Folate Panel     Status: None   Collection Time: 06/19/23 10:52 AM  Result Value Ref Range   Vitamin B-12 429 232 - 1,245 pg/mL   Folate 7.5 >3.0 ng/mL    Comment: A serum folate concentration of less than 3.1 ng/mL is considered to represent clinical deficiency.   VITAMIN D 25 Hydroxy (Vit-D Deficiency, Fractures)     Status: Abnormal   Collection Time: 06/19/23 10:52 AM  Result Value Ref Range   Vit D, 25-Hydroxy 13.1 (L) 30.0 - 100.0 ng/mL    Comment: Vitamin D deficiency has been defined by the Institute of Medicine and an Endocrine Society practice guideline as a level of serum 25-OH  vitamin D less than 20 ng/mL (1,2). The Endocrine Society went on to further define vitamin D insufficiency as a level between 21 and 29 ng/mL (2). 1. IOM (Institute of Medicine). 2010. Dietary reference    intakes for calcium and D. [place]: The    [employer]. 2. Holick MF, Binkley [place], Bischoff-Ferrari HA, et al.    Evaluation, treatment, and prevention of vitamin D    deficiency: an Endocrine Society clinical practice    guideline. JCEM. 2011 Jul; 96(7):1911-30.   Interpretation:     Status: None   Collection Time: 06/19/23 10:52 AM  Result Value Ref Range   HCV Interp 1: Comment     Comment: Not infected with HCV unless early or acute infection is suspected (which may be delayed in an immunocompromised individual), or other evidence exists to indicate HCV infection.   NuSwab Vaginitis Plus (VG+)     Status: None   Collection Time: 06/19/23 11:49 AM  Result Value Ref Range   Atopobium vaginae Low - 0 Score   BVAB 2 Low - 0 Score   Megasphaera 1 Low - 0 Score    Comment: Calculate total score by adding the 3 individual bacterial vaginosis (BV) marker scores together.  Total score is interpreted as follows: Total score 0-1: Indicates the absence of BV. Total score   2: Indeterminate for BV. Additional clinical                  data  should be evaluated to establish a                  diagnosis. Total score 3-6: Indicates the presence of BV.    Candida albicans, NAA Negative Negative   Candida glabrata, NAA Negative Negative   Trich vag by NAA Negative Negative   Chlamydia trachomatis, NAA Negative Negative   Neisseria gonorrhoeae, NAA Negative Negative        Assessment/Plan: 1. Encounter for general adult medical examination with abnormal findings AP performed, labs were ordered  2. Screen for STD (sexually transmitted disease) Pt performed self-swab and will be notified of results - NuSwab Vaginitis Plus (VG+)  3. Iron deficiency anemia due to  chronic blood loss - CBC w/Diff/Platelet - Fe+TIBC+Fer  4. B12 deficiency - B12 and Folate Panel  5. Vitamin D deficiency - VITAMIN D 25 Hydroxy (Vit-D Deficiency, Fractures)  6. Abnormal thyroid exam - TSH + free T4  7. Screening for STDs (sexually transmitted diseases) - STI Profile  8. Other fatigue - TSH + free T4 - CBC w/Diff/Platelet - Comprehensive metabolic panel - Lipid Panel With LDL/HDL Ratio - STI Profile - Fe+TIBC+Fer - B12 and Folate Panel   General Counseling: Ruwayda verbalizes understanding of the findings of todays visit and agrees with plan of treatment. I have discussed any further diagnostic evaluation that may be needed or ordered today. We also reviewed her medications today. she has been encouraged to call the office with any questions or concerns that should arise related to todays visit.    Counseling:    Orders Placed This Encounter  Procedures   NuSwab Vaginitis Plus (VG+)   TSH + free T4   CBC w/Diff/Platelet   Comprehensive metabolic panel   Lipid Panel With LDL/HDL Ratio   STI Profile   Fe+TIBC+Fer   B12 and Folate Panel   VITAMIN D 25 Hydroxy (Vit-D Deficiency, Fractures)   Interpretation:    No orders of the defined types were placed in this encounter.   This patient was seen by Lynn Ito, PA-C in collaboration with Dr. Beverely Risen as a part of collaborative care agreement.  Total time spent:35 Minutes  Time spent includes review of chart, medications, test results, and follow up plan with the patient.     Lyndon Code, MD  Internal Medicine

## 2023-06-20 LAB — COMPREHENSIVE METABOLIC PANEL
ALT: 9 [IU]/L (ref 0–32)
AST: 13 [IU]/L (ref 0–40)
Albumin: 3.1 g/dL — ABNORMAL LOW (ref 3.9–4.9)
Alkaline Phosphatase: 48 [IU]/L (ref 44–121)
BUN/Creatinine Ratio: 15 (ref 9–23)
BUN: 12 mg/dL (ref 6–20)
Bilirubin Total: <0.2 mg/dL (ref 0.0–1.2)
CO2: 25 mmol/L (ref 20–29)
Calcium: 8.1 mg/dL — ABNORMAL LOW (ref 8.7–10.2)
Chloride: 106 mmol/L (ref 96–106)
Creatinine, Ser: 0.8 mg/dL (ref 0.57–1.00)
Globulin, Total: 1.9 g/dL (ref 1.5–4.5)
Glucose: 101 mg/dL — ABNORMAL HIGH (ref 70–99)
Potassium: 4.7 mmol/L (ref 3.5–5.2)
Sodium: 140 mmol/L (ref 134–144)
Total Protein: 5 g/dL — ABNORMAL LOW (ref 6.0–8.5)
eGFR: 97 mL/min/{1.73_m2} (ref >59)

## 2023-06-20 LAB — VITAMIN D 25 HYDROXY (VIT D DEFICIENCY, FRACTURES): Vit D, 25-Hydroxy: 13.1 ng/mL — ABNORMAL LOW (ref 30.0–100.0)

## 2023-06-20 LAB — CBC WITH DIFFERENTIAL/PLATELET
Basophils Absolute: 0.1 10*3/uL (ref 0.0–0.2)
Basos: 1 %
EOS (ABSOLUTE): 0.4 10*3/uL (ref 0.0–0.4)
Eos: 5 %
Hematocrit: 28.8 % — ABNORMAL LOW (ref 34.0–46.6)
Hemoglobin: 7.8 g/dL — ABNORMAL LOW (ref 11.1–15.9)
Immature Grans (Abs): 0 10*3/uL (ref 0.0–0.1)
Immature Granulocytes: 0 %
Lymphocytes Absolute: 1.5 10*3/uL (ref 0.7–3.1)
Lymphs: 19 %
MCH: 19 pg — ABNORMAL LOW (ref 26.6–33.0)
MCHC: 27.1 g/dL — ABNORMAL LOW (ref 31.5–35.7)
MCV: 70 fL — ABNORMAL LOW (ref 79–97)
Monocytes Absolute: 0.6 10*3/uL (ref 0.1–0.9)
Monocytes: 8 %
Neutrophils Absolute: 5.1 10*3/uL (ref 1.4–7.0)
Neutrophils: 67 %
Platelets: 404 10*3/uL (ref 150–450)
RBC: 4.1 x10E6/uL (ref 3.77–5.28)
RDW: 17.2 % — ABNORMAL HIGH (ref 11.7–15.4)
WBC: 7.7 10*3/uL (ref 3.4–10.8)

## 2023-06-20 LAB — LIPID PANEL WITH LDL/HDL RATIO
Cholesterol, Total: 159 mg/dL (ref 100–199)
HDL: 29 mg/dL — ABNORMAL LOW (ref >39)
LDL Chol Calc (NIH): 99 mg/dL (ref 0–99)
LDL/HDL Ratio: 3.4 {ratio} — ABNORMAL HIGH (ref 0.0–3.2)
Triglycerides: 179 mg/dL — ABNORMAL HIGH (ref 0–149)
VLDL Cholesterol Cal: 31 mg/dL (ref 5–40)

## 2023-06-20 LAB — TSH+FREE T4
Free T4: 1.05 ng/dL (ref 0.82–1.77)
TSH: 2.06 u[IU]/mL (ref 0.450–4.500)

## 2023-06-20 LAB — STI PROFILE
HCV Ab: NONREACTIVE
HIV Screen 4th Generation wRfx: NONREACTIVE
Hep B Core Total Ab: NEGATIVE
Hep B Surface Ab, Qual: NONREACTIVE
Hepatitis B Surface Ag: NEGATIVE
RPR Ser Ql: NONREACTIVE

## 2023-06-20 LAB — B12 AND FOLATE PANEL
Folate: 7.5 ng/mL (ref >3.0)
Vitamin B-12: 429 pg/mL (ref 232–1245)

## 2023-06-20 LAB — IRON,TIBC AND FERRITIN PANEL
Ferritin: 7 ng/mL — ABNORMAL LOW (ref 15–150)
Iron Saturation: 4 % — CL (ref 15–55)
Iron: 11 ug/dL — ABNORMAL LOW (ref 27–159)
Total Iron Binding Capacity: 305 ug/dL (ref 250–450)
UIBC: 294 ug/dL (ref 131–425)

## 2023-06-20 LAB — HCV INTERPRETATION

## 2023-06-22 ENCOUNTER — Other Ambulatory Visit: Payer: Self-pay

## 2023-06-22 MED ORDER — ERGOCALCIFEROL 1.25 MG (50000 UT) PO CAPS: 50000.0000 [IU] | ORAL_CAPSULE | ORAL | 3 refills | Status: DC

## 2023-06-22 NOTE — Telephone Encounter (Signed)
Spoke with patient regarding lab results. 

## 2023-06-23 ENCOUNTER — Telehealth: Payer: Self-pay

## 2023-06-23 LAB — NUSWAB VAGINITIS PLUS (VG+)
Candida albicans, NAA: NEGATIVE
Candida glabrata, NAA: NEGATIVE
Chlamydia trachomatis, NAA: NEGATIVE
Neisseria gonorrhoeae, NAA: NEGATIVE
Trich vag by NAA: NEGATIVE

## 2023-06-23 NOTE — Telephone Encounter (Signed)
Sent MyChart message to patient regarding NuSwab results.

## 2023-06-23 NOTE — Telephone Encounter (Signed)
-----   Message from Carlean Jews sent at 06/23/2023  9:05 AM EDT ----- Please let her know that her nuswab results came back normal

## 2023-07-21 ENCOUNTER — Other Ambulatory Visit: Payer: Self-pay | Admitting: Physician Assistant

## 2023-07-21 ENCOUNTER — Telehealth: Payer: Self-pay | Admitting: Physician Assistant

## 2023-07-21 DIAGNOSIS — D500 Iron deficiency anemia secondary to blood loss (chronic): Secondary | ICD-10-CM

## 2023-07-22 NOTE — Telephone Encounter (Signed)
error 

## 2023-08-10 ENCOUNTER — Encounter: Payer: Self-pay | Admitting: Oncology

## 2023-08-10 ENCOUNTER — Inpatient Hospital Stay: Payer: BC Managed Care – PPO | Attending: Oncology | Admitting: Oncology

## 2023-08-10 ENCOUNTER — Inpatient Hospital Stay: Payer: BC Managed Care – PPO

## 2023-08-10 VITALS — BP 107/82 | HR 106 | Temp 97.1°F | Resp 18 | Wt 262.9 lb

## 2023-08-10 VITALS — BP 102/75 | HR 82 | Resp 16

## 2023-08-10 DIAGNOSIS — D500 Iron deficiency anemia secondary to blood loss (chronic): Secondary | ICD-10-CM | POA: Insufficient documentation

## 2023-08-10 DIAGNOSIS — N920 Excessive and frequent menstruation with regular cycle: Secondary | ICD-10-CM | POA: Insufficient documentation

## 2023-08-10 DIAGNOSIS — R5383 Other fatigue: Secondary | ICD-10-CM | POA: Insufficient documentation

## 2023-08-10 DIAGNOSIS — R0602 Shortness of breath: Secondary | ICD-10-CM | POA: Insufficient documentation

## 2023-08-10 DIAGNOSIS — Z9851 Tubal ligation status: Secondary | ICD-10-CM | POA: Insufficient documentation

## 2023-08-10 MED ORDER — IRON SUCROSE 20 MG/ML IV SOLN
200.0000 mg | Freq: Once | INTRAVENOUS | Status: AC
  Administered 2023-08-10: 200 mg via INTRAVENOUS
  Filled 2023-08-10: qty 10

## 2023-08-10 NOTE — Assessment & Plan Note (Signed)
Recommend Gyn evaluation. Patient plans to call her gyn.

## 2023-08-10 NOTE — Assessment & Plan Note (Signed)
Labs are reviewed and discussed with patient. Lab Results  Component Value Date   HGB 7.8 (L) 06/19/2023   TIBC 305 06/19/2023   IRONPCTSAT 4 (LL) 06/19/2023   FERRITIN 7 (L) 06/19/2023    I discussed about option of continue oral iron supplementation and repeat blood work for evaluation of treatment response.  If no significant improvement, then proceed with IV Venofer treatments. Alternative option of proceed with IV Venofer treatments. I discussed about the potential risks including but not limited to allergic reactions/infusion reactions including anaphylactic reactions, phlebitis, high blood pressure, wheezing, SOB, skin rash, weight gain,dark urine, leg swelling, back pain, headache, nausea and fatigue, etc. Patient tolerates oral iron supplement poorly and desires to achieved higher level of iron faster for adequate hematopoesis. Plan IV venofer weekly x 4

## 2023-08-10 NOTE — Progress Notes (Signed)
Hematology/Oncology Progress note [phone] [phone]            CHIEF COMPLAINTS/REASON FOR VISIT:   iron deficiency anemia.   ASSESSMENT & PLAN:   Iron deficiency anemia due to chronic blood loss Labs are reviewed and discussed with patient. Lab Results  Component Value Date   HGB 7.8 (L) 06/19/2023   TIBC 305 06/19/2023   IRONPCTSAT 4 (LL) 06/19/2023   FERRITIN 7 (L) 06/19/2023    I discussed about option of continue oral iron supplementation and repeat blood work for evaluation of treatment response.  If no significant improvement, then proceed with IV Venofer treatments. Alternative option of proceed with IV Venofer treatments. I discussed about the potential risks including but not limited to allergic reactions/infusion reactions including anaphylactic reactions, phlebitis, high blood pressure, wheezing, SOB, skin rash, weight gain,dark urine, leg swelling, back pain, headache, nausea and fatigue, etc. Patient tolerates oral iron supplement poorly and desires to achieved higher level of iron faster for adequate hematopoesis. Plan IV venofer weekly x 4   Menorrhagia Recommend Gyn evaluation. Patient plans to call her gyn.  Orders Placed This Encounter  Procedures   CBC with Differential (Cancer Center Only)    Standing Status:   Future    Standing Expiration Date:   08/09/2024   Iron and TIBC    Standing Status:   Future    Standing Expiration Date:   08/09/2024   Ferritin    Standing Status:   Future    Standing Expiration Date:   08/09/2024   Retic Panel    Standing Status:   Future    Standing Expiration Date:   08/09/2024   Follow up in 3 months.  All questions were answered. The patient knows to call the clinic with any problems, questions or concerns.  Rickard Patience, MD, PhD [facility] Hematology Oncology 08/10/2023    HISTORY OF PRESENTING ILLNESS:   Sherry Mack is a  37 y.o.  female with PMH listed below was seen in  consultation at the request of  Carlean Jews, PA*  for evaluation of iron deficiency anemia.   10/20/2021 Patient presented to [facility] ER for evaluation of increased SOB with exertion.  She had blood work done with PCP and was found to have severe anemia. She was sent to ER and received 2 units pf PRBC transfusion.  10/18/21 iron panel showed iron saturation of 22, ferritin 3.  10/20/21 CBC showed Hb was 5.3, mcv 58.9, platelet 383, wbc 7.4.  Heavy irregular menstrual period since her bilateral tubal ligation. She was previously on Depo.   + fatigue, SOB, craving ice chips.    INTERVAL HISTORY Sherry Mack is a 37 y.o. female who has above history reviewed by me today presents for follow up visit for iron deficiency anemia.  Last seen in Feb 2023 and was recommended IV Venofer. She decided not to proceed at that time and lost follow up.  She continues to have heavy menstrual period.  + fatigue, SOB with exertion.  + crave for ice chips Review of Systems  Constitutional:  Positive for fatigue. Negative for appetite change, chills and fever.  HENT:   Negative for hearing loss and voice change.   Eyes:  Negative for eye problems.  Respiratory:  Positive for shortness of breath. Negative for chest tightness and cough.   Cardiovascular:  Negative for chest pain.  Gastrointestinal:  Negative for abdominal distention, abdominal pain and blood in stool.  Endocrine: Negative  for hot flashes.  Genitourinary:  Positive for menstrual problem. Negative for difficulty urinating and frequency.   Musculoskeletal:  Negative for arthralgias.  Skin:  Negative for itching and rash.  Neurological:  Negative for extremity weakness.  Hematological:  Negative for adenopathy.  Psychiatric/Behavioral:  Negative for confusion.     MEDICAL HISTORY:  Past Medical History:  Diagnosis Date   Iron deficiency anemia due to chronic blood loss 10/23/2021   STD (sexually transmitted disease)    20+ years ago- trich  in pap 06/12/2016    SURGICAL HISTORY: Past Surgical History:  Procedure Laterality Date   TUBAL LIGATION      SOCIAL HISTORY: Social History   Socioeconomic History   Marital status: Single    Spouse name: Not on file   Number of children: Not on file   Years of education: Not on file   Highest education level: Not on file  Occupational History   Not on file  Tobacco Use   Smoking status: Never   Smokeless tobacco: Never  Vaping Use   Vaping status: Never Used  Substance and Sexual Activity   Alcohol use: Yes    Alcohol/week: 2.0 standard drinks of alcohol    Types: 2 Standard drinks or equivalent per week    Comment: occ   Drug use: Not Currently    Types: Marijuana    Comment: last use age 22   Sexual activity: Yes    Partners: Male    Birth control/protection: Surgical  Other Topics Concern   Not on file  Social History Narrative   Not on file   Social Determinants of Health   Financial Resource Strain: Not on file  Food Insecurity: Not on file  Transportation Needs: Not on file  Physical Activity: Not on file  Stress: Not on file  Social Connections: Not on file  Intimate Partner Violence: Not on file    FAMILY HISTORY: Family History  Problem Relation Age of Onset   Heart disease Mother    Stroke Maternal Grandmother    Heart disease Maternal Grandmother    Breast cancer Neg Hx    Ovarian cancer Neg Hx    Colon cancer Neg Hx    Cancer Neg Hx     ALLERGIES:  has No Known Allergies.  MEDICATIONS:  Current Outpatient Medications  Medication Sig Dispense Refill   ergocalciferol (DRISDOL) 1.25 MG (50000 UT) capsule Take 1 capsule (50,000 Units total) by mouth once a week. 4 capsule 3   ferrous sulfate 325 (65 FE) MG tablet TAKE 1 TABLET BY MOUTH EVERY DAY WITH BREAKFAST 90 tablet 1   Multiple Vitamin (MULTIVITAMIN) tablet Take 1 tablet by mouth daily.     Current Facility-Administered Medications  Medication Dose Route Frequency Provider Last  Rate Last Admin   cyanocobalamin ((VITAMIN B-12)) injection 1,000 mcg  1,000 mcg Intramuscular Once McDonough, Lauren K, PA-C         PHYSICAL EXAMINATION: ECOG PERFORMANCE STATUS: 1 - Symptomatic but completely ambulatory Vitals:   08/10/23 1343  BP: 107/82  Pulse: (!) 106  Resp: 18  Temp: (!) 97.1 F (36.2 C)  SpO2: 98%   Filed Weights   08/10/23 1343  Weight: 262 lb 14.4 oz (119.3 kg)    Physical Exam Constitutional:      General: She is not in acute distress.    Appearance: She is obese.  HENT:     Head: Normocephalic and atraumatic.  Eyes:     General: No scleral icterus.  Cardiovascular:     Rate and Rhythm: Normal rate and regular rhythm.  Pulmonary:     Effort: Pulmonary effort is normal. No respiratory distress.     Breath sounds: No wheezing.  Abdominal:     General: Bowel sounds are normal. There is no distension.     Palpations: Abdomen is soft.  Musculoskeletal:        General: No deformity. Normal range of motion.     Cervical back: Normal range of motion and neck supple.  Skin:    General: Skin is warm and dry.  Neurological:     General: No focal deficit present.     Mental Status: She is alert and oriented to person, place, and time. Mental status is at baseline.  Psychiatric:        Mood and Affect: Mood normal.     LABORATORY DATA:  I have reviewed the data as listed Lab Results  Component Value Date   WBC 7.7 06/19/2023   HGB 7.8 (L) 06/19/2023   HCT 28.8 (L) 06/19/2023   MCV 70 (L) 06/19/2023   PLT 404 06/19/2023   Recent Labs    06/19/23 1052  NA 140  K 4.7  CL 106  CO2 25  GLUCOSE 101*  BUN 12  CREATININE 0.80  CALCIUM 8.1*  PROT 5.0*  ALBUMIN 3.1*  AST 13  ALT 9  ALKPHOS 48  BILITOT <0.2   Iron/TIBC/Ferritin/ %Sat    Component Value Date/Time   IRON 11 (L) 06/19/2023 1052   TIBC 305 06/19/2023 1052   FERRITIN 7 (L) 06/19/2023 1052   IRONPCTSAT 4 (LL) 06/19/2023 1052      RADIOGRAPHIC STUDIES: I have  personally reviewed the radiological images as listed and agreed with the findings in the report. No results found.

## 2023-08-24 ENCOUNTER — Inpatient Hospital Stay: Payer: BC Managed Care – PPO | Attending: Oncology

## 2023-08-24 VITALS — BP 118/64 | HR 80 | Temp 97.1°F | Resp 16

## 2023-08-24 DIAGNOSIS — R5383 Other fatigue: Secondary | ICD-10-CM | POA: Diagnosis not present

## 2023-08-24 DIAGNOSIS — R0602 Shortness of breath: Secondary | ICD-10-CM | POA: Diagnosis not present

## 2023-08-24 DIAGNOSIS — Z9851 Tubal ligation status: Secondary | ICD-10-CM | POA: Insufficient documentation

## 2023-08-24 DIAGNOSIS — D500 Iron deficiency anemia secondary to blood loss (chronic): Secondary | ICD-10-CM | POA: Insufficient documentation

## 2023-08-24 DIAGNOSIS — N920 Excessive and frequent menstruation with regular cycle: Secondary | ICD-10-CM | POA: Insufficient documentation

## 2023-08-24 MED ORDER — IRON SUCROSE 20 MG/ML IV SOLN
200.0000 mg | Freq: Once | INTRAVENOUS | Status: AC
  Administered 2023-08-24: 200 mg via INTRAVENOUS
  Filled 2023-08-24: qty 10

## 2023-08-24 NOTE — Progress Notes (Signed)
Pt declined 30 minute post observation. Tolerated tx without incident. Educated on delayed reactions and to seek tx in ER

## 2023-08-31 ENCOUNTER — Inpatient Hospital Stay: Payer: BC Managed Care – PPO

## 2023-09-02 ENCOUNTER — Encounter: Payer: Self-pay | Admitting: Oncology

## 2023-09-07 ENCOUNTER — Inpatient Hospital Stay: Payer: BC Managed Care – PPO

## 2023-09-10 ENCOUNTER — Inpatient Hospital Stay: Payer: BC Managed Care – PPO

## 2023-09-14 ENCOUNTER — Inpatient Hospital Stay: Payer: BC Managed Care – PPO

## 2023-09-14 ENCOUNTER — Encounter: Payer: Self-pay | Admitting: Oncology

## 2023-09-21 ENCOUNTER — Inpatient Hospital Stay: Payer: 59 | Attending: Oncology

## 2023-09-21 VITALS — BP 117/66 | HR 74 | Temp 96.1°F | Resp 18

## 2023-09-21 DIAGNOSIS — N920 Excessive and frequent menstruation with regular cycle: Secondary | ICD-10-CM | POA: Insufficient documentation

## 2023-09-21 DIAGNOSIS — D500 Iron deficiency anemia secondary to blood loss (chronic): Secondary | ICD-10-CM | POA: Diagnosis present

## 2023-09-21 DIAGNOSIS — Z9851 Tubal ligation status: Secondary | ICD-10-CM | POA: Insufficient documentation

## 2023-09-21 DIAGNOSIS — R0602 Shortness of breath: Secondary | ICD-10-CM | POA: Diagnosis not present

## 2023-09-21 DIAGNOSIS — R5383 Other fatigue: Secondary | ICD-10-CM | POA: Insufficient documentation

## 2023-09-21 MED ORDER — SODIUM CHLORIDE 0.9% FLUSH
10.0000 mL | Freq: Once | INTRAVENOUS | Status: AC | PRN
Start: 2023-09-21 — End: 2023-09-21
  Administered 2023-09-21: 10 mL
  Filled 2023-09-21: qty 10

## 2023-09-21 MED ORDER — IRON SUCROSE 20 MG/ML IV SOLN
200.0000 mg | Freq: Once | INTRAVENOUS | Status: AC
  Administered 2023-09-21: 200 mg via INTRAVENOUS
  Filled 2023-09-21: qty 10

## 2023-09-28 ENCOUNTER — Encounter: Payer: Self-pay | Admitting: Oncology

## 2023-09-28 ENCOUNTER — Inpatient Hospital Stay: Payer: 59

## 2023-09-28 VITALS — BP 111/65 | HR 76 | Temp 98.2°F | Resp 18

## 2023-09-28 DIAGNOSIS — D500 Iron deficiency anemia secondary to blood loss (chronic): Secondary | ICD-10-CM | POA: Diagnosis not present

## 2023-09-28 MED ORDER — SODIUM CHLORIDE 0.9% FLUSH
10.0000 mL | Freq: Once | INTRAVENOUS | Status: AC | PRN
  Administered 2023-09-28: 10 mL
  Filled 2023-09-28: qty 10

## 2023-09-28 MED ORDER — IRON SUCROSE 20 MG/ML IV SOLN
200.0000 mg | Freq: Once | INTRAVENOUS | Status: AC
  Administered 2023-09-28: 200 mg via INTRAVENOUS

## 2023-11-05 ENCOUNTER — Inpatient Hospital Stay: Payer: 59 | Attending: Oncology

## 2023-11-06 ENCOUNTER — Inpatient Hospital Stay: Payer: 59 | Attending: Oncology

## 2023-11-06 DIAGNOSIS — R5383 Other fatigue: Secondary | ICD-10-CM | POA: Insufficient documentation

## 2023-11-06 DIAGNOSIS — D500 Iron deficiency anemia secondary to blood loss (chronic): Secondary | ICD-10-CM | POA: Insufficient documentation

## 2023-11-06 DIAGNOSIS — R0602 Shortness of breath: Secondary | ICD-10-CM | POA: Insufficient documentation

## 2023-11-06 DIAGNOSIS — Z9851 Tubal ligation status: Secondary | ICD-10-CM | POA: Insufficient documentation

## 2023-11-06 DIAGNOSIS — N920 Excessive and frequent menstruation with regular cycle: Secondary | ICD-10-CM | POA: Insufficient documentation

## 2023-11-11 ENCOUNTER — Encounter: Payer: Self-pay | Admitting: Oncology

## 2023-11-11 ENCOUNTER — Inpatient Hospital Stay: Payer: 59

## 2023-11-11 ENCOUNTER — Inpatient Hospital Stay (HOSPITAL_BASED_OUTPATIENT_CLINIC_OR_DEPARTMENT_OTHER): Payer: 59 | Admitting: Oncology

## 2023-11-11 VITALS — BP 109/73 | HR 89 | Temp 97.7°F | Resp 18 | Ht 68.0 in | Wt 270.0 lb

## 2023-11-11 DIAGNOSIS — D500 Iron deficiency anemia secondary to blood loss (chronic): Secondary | ICD-10-CM

## 2023-11-11 DIAGNOSIS — R5383 Other fatigue: Secondary | ICD-10-CM | POA: Diagnosis not present

## 2023-11-11 DIAGNOSIS — N920 Excessive and frequent menstruation with regular cycle: Secondary | ICD-10-CM | POA: Diagnosis present

## 2023-11-11 DIAGNOSIS — Z9851 Tubal ligation status: Secondary | ICD-10-CM | POA: Diagnosis not present

## 2023-11-11 DIAGNOSIS — R0602 Shortness of breath: Secondary | ICD-10-CM | POA: Diagnosis not present

## 2023-11-11 LAB — CBC WITH DIFFERENTIAL (CANCER CENTER ONLY)
Abs Immature Granulocytes: 0.09 10*3/uL — ABNORMAL HIGH (ref 0.00–0.07)
Basophils Absolute: 0 10*3/uL (ref 0.0–0.1)
Basophils Relative: 0 %
Eosinophils Absolute: 0.3 10*3/uL (ref 0.0–0.5)
Eosinophils Relative: 3 %
HCT: 30.6 % — ABNORMAL LOW (ref 36.0–46.0)
Hemoglobin: 9.3 g/dL — ABNORMAL LOW (ref 12.0–15.0)
Immature Granulocytes: 1 %
Lymphocytes Relative: 21 %
Lymphs Abs: 2 10*3/uL (ref 0.7–4.0)
MCH: 21.7 pg — ABNORMAL LOW (ref 26.0–34.0)
MCHC: 30.4 g/dL (ref 30.0–36.0)
MCV: 71.5 fL — ABNORMAL LOW (ref 80.0–100.0)
Monocytes Absolute: 0.8 10*3/uL (ref 0.1–1.0)
Monocytes Relative: 9 %
Neutro Abs: 6.5 10*3/uL (ref 1.7–7.7)
Neutrophils Relative %: 66 %
Platelet Count: 336 10*3/uL (ref 150–400)
RBC: 4.28 MIL/uL (ref 3.87–5.11)
RDW: 20.3 % — ABNORMAL HIGH (ref 11.5–15.5)
WBC Count: 9.7 10*3/uL (ref 4.0–10.5)
nRBC: 0 % (ref 0.0–0.2)

## 2023-11-11 LAB — RETIC PANEL
Immature Retic Fract: 34.2 % — ABNORMAL HIGH (ref 2.3–15.9)
RBC.: 4.24 MIL/uL (ref 3.87–5.11)
Retic Count, Absolute: 81 10*3/uL (ref 19.0–186.0)
Retic Ct Pct: 1.9 % (ref 0.4–3.1)
Reticulocyte Hemoglobin: 23.7 pg — ABNORMAL LOW (ref >27.9)

## 2023-11-11 LAB — IRON AND TIBC
Iron: 23 ug/dL — ABNORMAL LOW (ref 28–170)
Saturation Ratios: 7 % — ABNORMAL LOW (ref 10.4–31.8)
TIBC: 342 ug/dL (ref 250–450)
UIBC: 319 ug/dL

## 2023-11-11 LAB — FERRITIN: Ferritin: 11 ng/mL (ref 11–307)

## 2023-11-11 NOTE — Assessment & Plan Note (Signed)
 Recommend Gyn evaluation.

## 2023-11-11 NOTE — Assessment & Plan Note (Addendum)
 Labs are reviewed and discussed with patient. Lab Results  Component Value Date   HGB 9.3 (L) 11/11/2023   TIBC 342 11/11/2023   IRONPCTSAT 7 (L) 11/11/2023   FERRITIN 11 11/11/2023    Today's lab showed improved Hb and panel, still decreased IV venofer weekly x 4

## 2023-11-11 NOTE — Progress Notes (Signed)
 Hematology/Oncology Progress note Telephone:(336) [phone] Fax:(336) [phone]            CHIEF COMPLAINTS/REASON FOR VISIT:   iron deficiency anemia.   ASSESSMENT & PLAN:   Iron deficiency anemia due to chronic blood loss Labs are reviewed and discussed with patient. Lab Results  Component Value Date   HGB 9.3 (L) 11/11/2023   TIBC 342 11/11/2023   IRONPCTSAT 7 (L) 11/11/2023   FERRITIN 11 11/11/2023    Today's lab showed improved Hb and panel, still decreased IV venofer weekly x 4   Menorrhagia Recommend Gyn evaluation.   Orders Placed This Encounter  Procedures   Ferritin    Standing Status:   Future    Expected Date:   03/10/2024    Expiration Date:   05/10/2024   Iron and TIBC    Standing Status:   Future    Expected Date:   03/10/2024    Expiration Date:   11/10/2024   CBC with Differential/Platelet    Standing Status:   Future    Expected Date:   03/10/2024    Expiration Date:   11/10/2024   Retic Panel    Standing Status:   Future    Expected Date:   03/10/2024    Expiration Date:   11/10/2024   Follow up in 4 months.  All questions were answered. The patient knows to call the clinic with any problems, questions or concerns.  Rickard Patience, MD, PhD [facility] Hematology Oncology 11/11/2023    HISTORY OF PRESENTING ILLNESS:   Sherry Mack is a  38 y.o.  female with PMH listed below was seen in consultation at the request of  Carlean Jews, PA*  for evaluation of iron deficiency anemia.   10/20/2021 Patient presented to [facility] ER for evaluation of increased SOB with exertion.  She had blood work done with PCP and was found to have severe anemia. She was sent to ER and received 2 units pf PRBC transfusion.  10/18/21 iron panel showed iron saturation of 22, ferritin 3.  10/20/21 CBC showed Hb was 5.3, mcv 58.9, platelet 383, wbc 7.4.  Heavy irregular menstrual period since her bilateral tubal ligation. She was previously on Depo.   + fatigue, SOB, craving ice  chips.    INTERVAL HISTORY Sherry Mack is a 38 y.o. female who has above history reviewed by me today presents for follow up visit for iron deficiency anemia.  Last seen in Feb 2023 and was recommended IV Venofer. She decided not to proceed at that time and lost follow up.  She continues to have heavy menstrual period.  + fatigue has improved. She tolerates IV Venofer treatments.   Review of Systems  Constitutional:  Positive for fatigue. Negative for appetite change, chills and fever.  HENT:   Negative for hearing loss and voice change.   Eyes:  Negative for eye problems.  Respiratory:  Positive for shortness of breath. Negative for chest tightness and cough.   Cardiovascular:  Negative for chest pain.  Gastrointestinal:  Negative for abdominal distention, abdominal pain and blood in stool.  Endocrine: Negative for hot flashes.  Genitourinary:  Positive for menstrual problem. Negative for difficulty urinating and frequency.   Musculoskeletal:  Negative for arthralgias.  Skin:  Negative for itching and rash.  Neurological:  Negative for extremity weakness.  Hematological:  Negative for adenopathy.  Psychiatric/Behavioral:  Negative for confusion.     MEDICAL HISTORY:  Past Medical History:  Diagnosis Date   Iron deficiency  anemia due to chronic blood loss 10/23/2021   STD (sexually transmitted disease)    20+ years ago- trich in pap 06/12/2016    SURGICAL HISTORY: Past Surgical History:  Procedure Laterality Date   TUBAL LIGATION      SOCIAL HISTORY: Social History   Socioeconomic History   Marital status: Single    Spouse name: Not on file   Number of children: Not on file   Years of education: Not on file   Highest education level: Not on file  Occupational History   Not on file  Tobacco Use   Smoking status: Never   Smokeless tobacco: Never  Vaping Use   Vaping status: Never Used  Substance and Sexual Activity   Alcohol use: Yes    Alcohol/week: 2.0 standard  drinks of alcohol    Types: 2 Standard drinks or equivalent per week    Comment: occ   Drug use: Not Currently    Types: Marijuana    Comment: last use age 13   Sexual activity: Yes    Partners: Male    Birth control/protection: Surgical  Other Topics Concern   Not on file  Social History Narrative   Not on file   Social Drivers of Health   Financial Resource Strain: Not on file  Food Insecurity: Not on file  Transportation Needs: Not on file  Physical Activity: Not on file  Stress: Not on file  Social Connections: Not on file  Intimate Partner Violence: Not on file    FAMILY HISTORY: Family History  Problem Relation Age of Onset   Heart disease Mother    Stroke Maternal Grandmother    Heart disease Maternal Grandmother    Breast cancer Neg Hx    Ovarian cancer Neg Hx    Colon cancer Neg Hx    Cancer Neg Hx     ALLERGIES:  has no known allergies.  MEDICATIONS:  Current Outpatient Medications  Medication Sig Dispense Refill   ergocalciferol (DRISDOL) 1.25 MG (50000 UT) capsule Take 1 capsule (50,000 Units total) by mouth once a week. (Patient not taking: Reported on 11/11/2023) 4 capsule 3   ferrous sulfate 325 (65 FE) MG tablet TAKE 1 TABLET BY MOUTH EVERY DAY WITH BREAKFAST (Patient not taking: Reported on 11/11/2023) 90 tablet 1   Multiple Vitamin (MULTIVITAMIN) tablet Take 1 tablet by mouth daily. (Patient not taking: Reported on 11/11/2023)     Current Facility-Administered Medications  Medication Dose Route Frequency Provider Last Rate Last Admin   cyanocobalamin ((VITAMIN B-12)) injection 1,000 mcg  1,000 mcg Intramuscular Once McDonough, Lauren K, PA-C         PHYSICAL EXAMINATION: ECOG PERFORMANCE STATUS: 1 - Symptomatic but completely ambulatory Vitals:   11/11/23 1414 11/11/23 1417  BP: 109/73   Pulse: 89   Resp: 18   Temp: 97.7 F (36.5 C) 97.7 F (36.5 C)  SpO2: 99%    Filed Weights   11/11/23 1414  Weight: 270 lb (122.5 kg)    Physical  Exam Constitutional:      General: She is not in acute distress.    Appearance: She is obese.  HENT:     Head: Normocephalic and atraumatic.  Eyes:     General: No scleral icterus. Cardiovascular:     Rate and Rhythm: Normal rate and regular rhythm.  Pulmonary:     Effort: Pulmonary effort is normal. No respiratory distress.     Breath sounds: No wheezing.  Abdominal:     General: Bowel  sounds are normal. There is no distension.     Palpations: Abdomen is soft.  Musculoskeletal:        General: No deformity. Normal range of motion.     Cervical back: Normal range of motion and neck supple.  Skin:    General: Skin is warm and dry.  Neurological:     General: No focal deficit present.     Mental Status: She is alert and oriented to person, place, and time. Mental status is at baseline.  Psychiatric:        Mood and Affect: Mood normal.     LABORATORY DATA:  I have reviewed the data as listed Lab Results  Component Value Date   WBC 9.7 11/11/2023   HGB 9.3 (L) 11/11/2023   HCT 30.6 (L) 11/11/2023   MCV 71.5 (L) 11/11/2023   PLT 336 11/11/2023   Recent Labs    06/19/23 1052  NA 140  K 4.7  CL 106  CO2 25  GLUCOSE 101*  BUN 12  CREATININE 0.80  CALCIUM 8.1*  PROT 5.0*  ALBUMIN 3.1*  AST 13  ALT 9  ALKPHOS 48  BILITOT <0.2   Iron/TIBC/Ferritin/ %Sat    Component Value Date/Time   IRON 23 (L) 11/11/2023 1446   IRON 11 (L) 06/19/2023 1052   TIBC 342 11/11/2023 1446   TIBC 305 06/19/2023 1052   FERRITIN 11 11/11/2023 1446   FERRITIN 7 (L) 06/19/2023 1052   IRONPCTSAT 7 (L) 11/11/2023 1446   IRONPCTSAT 4 (LL) 06/19/2023 1052      RADIOGRAPHIC STUDIES: I have personally reviewed the radiological images as listed and agreed with the findings in the report. No results found.

## 2023-11-12 ENCOUNTER — Telehealth: Payer: Self-pay

## 2023-11-12 NOTE — Telephone Encounter (Signed)
-----   Message from Rickard Patience sent at 11/11/2023  9:49 PM EST ----- Please let patient know that her iron panel has improved, however still low.  Recommend IV Venofer weekly x 4.  Follow up in 4 months lab prior to MD +/- Venofer . Labs are ordered.

## 2023-11-12 NOTE — Telephone Encounter (Signed)
 Spoke to pt and informed her of MD recommendation. Pt is agreeable.   Please set up appts and pt will see them on Mychart. Pt prefers Friday afternoons. She is ok with any day/tim when seeing Dr. Cathie Hoops.   IV venofer weekly x4 4 months: Labs prior to MD/venofer

## 2023-11-20 ENCOUNTER — Inpatient Hospital Stay: Payer: 59 | Attending: Oncology

## 2023-11-20 VITALS — BP 110/68 | HR 95 | Temp 98.7°F | Resp 16

## 2023-11-20 DIAGNOSIS — R5383 Other fatigue: Secondary | ICD-10-CM | POA: Insufficient documentation

## 2023-11-20 DIAGNOSIS — R0602 Shortness of breath: Secondary | ICD-10-CM | POA: Insufficient documentation

## 2023-11-20 DIAGNOSIS — Z9851 Tubal ligation status: Secondary | ICD-10-CM | POA: Diagnosis not present

## 2023-11-20 DIAGNOSIS — N920 Excessive and frequent menstruation with regular cycle: Secondary | ICD-10-CM | POA: Insufficient documentation

## 2023-11-20 DIAGNOSIS — D500 Iron deficiency anemia secondary to blood loss (chronic): Secondary | ICD-10-CM | POA: Insufficient documentation

## 2023-11-20 MED ORDER — IRON SUCROSE 20 MG/ML IV SOLN
200.0000 mg | Freq: Once | INTRAVENOUS | Status: AC
  Administered 2023-11-20: 200 mg via INTRAVENOUS
  Filled 2023-11-20: qty 10

## 2023-11-27 ENCOUNTER — Inpatient Hospital Stay: Payer: 59

## 2023-11-27 VITALS — BP 120/64 | HR 74 | Temp 96.3°F | Resp 18

## 2023-11-27 DIAGNOSIS — D500 Iron deficiency anemia secondary to blood loss (chronic): Secondary | ICD-10-CM

## 2023-11-27 MED ORDER — IRON SUCROSE 20 MG/ML IV SOLN
200.0000 mg | Freq: Once | INTRAVENOUS | Status: AC
Start: 2023-11-27 — End: 2023-11-27
  Administered 2023-11-27: 200 mg via INTRAVENOUS

## 2023-11-27 MED ORDER — SODIUM CHLORIDE 0.9% FLUSH
10.0000 mL | Freq: Once | INTRAVENOUS | Status: AC | PRN
  Administered 2023-11-27: 10 mL
  Filled 2023-11-27: qty 10

## 2023-12-04 ENCOUNTER — Inpatient Hospital Stay: Payer: 59

## 2023-12-04 VITALS — BP 111/66 | HR 83 | Temp 98.7°F | Resp 16

## 2023-12-04 DIAGNOSIS — D500 Iron deficiency anemia secondary to blood loss (chronic): Secondary | ICD-10-CM

## 2023-12-04 MED ORDER — SODIUM CHLORIDE 0.9% FLUSH
10.0000 mL | Freq: Once | INTRAVENOUS | Status: AC | PRN
Start: 2023-12-04 — End: 2023-12-04
  Administered 2023-12-04: 10 mL
  Filled 2023-12-04: qty 10

## 2023-12-04 MED ORDER — IRON SUCROSE 20 MG/ML IV SOLN
200.0000 mg | Freq: Once | INTRAVENOUS | Status: AC
  Administered 2023-12-04: 200 mg via INTRAVENOUS
  Filled 2023-12-04: qty 10

## 2023-12-04 NOTE — Progress Notes (Signed)
 Patient aware of risks and declined post observation.

## 2023-12-04 NOTE — Patient Instructions (Signed)

## 2023-12-11 ENCOUNTER — Inpatient Hospital Stay: Payer: 59

## 2023-12-11 VITALS — BP 121/63 | HR 84 | Temp 97.4°F | Resp 16

## 2023-12-11 DIAGNOSIS — D500 Iron deficiency anemia secondary to blood loss (chronic): Secondary | ICD-10-CM | POA: Diagnosis not present

## 2023-12-11 MED ORDER — IRON SUCROSE 20 MG/ML IV SOLN
200.0000 mg | Freq: Once | INTRAVENOUS | Status: AC
Start: 2023-12-11 — End: 2023-12-11
  Administered 2023-12-11: 200 mg via INTRAVENOUS
  Filled 2023-12-11: qty 10

## 2024-03-11 ENCOUNTER — Inpatient Hospital Stay: Payer: 59 | Attending: Oncology

## 2024-03-11 DIAGNOSIS — Z9851 Tubal ligation status: Secondary | ICD-10-CM | POA: Insufficient documentation

## 2024-03-11 DIAGNOSIS — N920 Excessive and frequent menstruation with regular cycle: Secondary | ICD-10-CM | POA: Insufficient documentation

## 2024-03-11 DIAGNOSIS — R0602 Shortness of breath: Secondary | ICD-10-CM | POA: Insufficient documentation

## 2024-03-11 DIAGNOSIS — D500 Iron deficiency anemia secondary to blood loss (chronic): Secondary | ICD-10-CM | POA: Insufficient documentation

## 2024-03-11 DIAGNOSIS — R5383 Other fatigue: Secondary | ICD-10-CM | POA: Diagnosis not present

## 2024-03-11 LAB — CBC WITH DIFFERENTIAL/PLATELET
Abs Immature Granulocytes: 0.05 10*3/uL (ref 0.00–0.07)
Basophils Absolute: 0.1 10*3/uL (ref 0.0–0.1)
Basophils Relative: 1 %
Eosinophils Absolute: 0.4 10*3/uL (ref 0.0–0.5)
Eosinophils Relative: 3 %
HCT: 32.4 % — ABNORMAL LOW (ref 36.0–46.0)
Hemoglobin: 10.2 g/dL — ABNORMAL LOW (ref 12.0–15.0)
Immature Granulocytes: 1 %
Lymphocytes Relative: 18 %
Lymphs Abs: 1.9 10*3/uL (ref 0.7–4.0)
MCH: 24.1 pg — ABNORMAL LOW (ref 26.0–34.0)
MCHC: 31.5 g/dL (ref 30.0–36.0)
MCV: 76.4 fL — ABNORMAL LOW (ref 80.0–100.0)
Monocytes Absolute: 0.8 10*3/uL (ref 0.1–1.0)
Monocytes Relative: 7 %
Neutro Abs: 7.4 10*3/uL (ref 1.7–7.7)
Neutrophils Relative %: 70 %
Platelets: 320 10*3/uL (ref 150–400)
RBC: 4.24 MIL/uL (ref 3.87–5.11)
RDW: 15.1 % (ref 11.5–15.5)
WBC: 10.5 10*3/uL (ref 4.0–10.5)
nRBC: 0 % (ref 0.0–0.2)

## 2024-03-11 LAB — IRON AND TIBC
Iron: 19 ug/dL — ABNORMAL LOW (ref 28–170)
Saturation Ratios: 5 % — ABNORMAL LOW (ref 10.4–31.8)
TIBC: 372 ug/dL (ref 250–450)
UIBC: 353 ug/dL

## 2024-03-11 LAB — FERRITIN: Ferritin: 9 ng/mL — ABNORMAL LOW (ref 11–307)

## 2024-03-11 LAB — RETIC PANEL
Immature Retic Fract: 30.7 % — ABNORMAL HIGH (ref 2.3–15.9)
RBC.: 4.29 MIL/uL (ref 3.87–5.11)
Retic Count, Absolute: 97 10*3/uL (ref 19.0–186.0)
Retic Ct Pct: 2.3 % (ref 0.4–3.1)
Reticulocyte Hemoglobin: 22.3 pg — ABNORMAL LOW (ref >27.9)

## 2024-03-16 ENCOUNTER — Inpatient Hospital Stay

## 2024-03-16 ENCOUNTER — Inpatient Hospital Stay: Admitting: Oncology

## 2024-03-17 ENCOUNTER — Ambulatory Visit: Payer: 59 | Admitting: Oncology

## 2024-03-17 ENCOUNTER — Ambulatory Visit: Payer: 59

## 2024-03-29 ENCOUNTER — Encounter: Payer: Self-pay | Admitting: Oncology

## 2024-03-29 ENCOUNTER — Inpatient Hospital Stay: Attending: Oncology | Admitting: Oncology

## 2024-03-29 ENCOUNTER — Inpatient Hospital Stay

## 2024-03-29 VITALS — BP 124/82 | HR 88 | Resp 20 | Wt 271.0 lb

## 2024-03-29 VITALS — BP 125/81 | HR 86 | Resp 18

## 2024-03-29 DIAGNOSIS — R0602 Shortness of breath: Secondary | ICD-10-CM | POA: Insufficient documentation

## 2024-03-29 DIAGNOSIS — N920 Excessive and frequent menstruation with regular cycle: Secondary | ICD-10-CM | POA: Diagnosis not present

## 2024-03-29 DIAGNOSIS — D500 Iron deficiency anemia secondary to blood loss (chronic): Secondary | ICD-10-CM | POA: Diagnosis present

## 2024-03-29 DIAGNOSIS — R5383 Other fatigue: Secondary | ICD-10-CM | POA: Diagnosis not present

## 2024-03-29 DIAGNOSIS — Z9851 Tubal ligation status: Secondary | ICD-10-CM | POA: Insufficient documentation

## 2024-03-29 MED ORDER — IRON SUCROSE 20 MG/ML IV SOLN
200.0000 mg | Freq: Once | INTRAVENOUS | Status: AC
  Administered 2024-03-29: 200 mg via INTRAVENOUS
  Filled 2024-03-29: qty 10

## 2024-03-29 NOTE — Patient Instructions (Signed)

## 2024-03-29 NOTE — Assessment & Plan Note (Signed)
 Recommend Gyn evaluation.

## 2024-03-29 NOTE — Progress Notes (Signed)
 Hematology/Oncology Progress note [phone] [phone]            CHIEF COMPLAINTS/REASON FOR VISIT:   iron  deficiency anemia.   ASSESSMENT & PLAN:   Iron  deficiency anemia due to chronic blood loss Labs are reviewed and discussed with patient. Lab Results  Component Value Date   HGB 10.2 (L) 03/11/2024   TIBC 372 03/11/2024   IRONPCTSAT 5 (L) 03/11/2024   FERRITIN 9 (L) 03/11/2024    Today's lab showed improved Hb and panel, still decreased IV venofer  weekly x 4   Menorrhagia Recommend Gyn evaluation.   Orders Placed This Encounter  Procedures   CBC with Differential (Cancer Center Only)    Standing Status:   Future    Expected Date:   07/30/2024    Expiration Date:   10/28/2024   Iron  and TIBC    Standing Status:   Future    Expected Date:   07/30/2024    Expiration Date:   10/28/2024   Ferritin    Standing Status:   Future    Expected Date:   07/30/2024    Expiration Date:   10/28/2024   Retic Panel    Standing Status:   Future    Expected Date:   07/30/2024    Expiration Date:   10/28/2024   Follow up in 4 months.  All questions were answered. The patient knows to call the clinic with any problems, questions or concerns.  Zelphia Cap, MD, PhD [facility] Hematology Oncology 03/29/2024    HISTORY OF PRESENTING ILLNESS:   Sherry Mack is a  38 y.o.  female with PMH listed below was seen in consultation at the request of  Kristina Tinnie POUR, PA*  for evaluation of iron  deficiency anemia.   10/20/2021 Patient presented to [facility] ER for evaluation of increased SOB with exertion.  She had blood work done with PCP and was found to have severe anemia. She was sent to ER and received 2 units pf PRBC transfusion.  10/18/21 iron  panel showed iron  saturation of 22, ferritin 3.  10/20/21 CBC showed Hb was 5.3, mcv 58.9, platelet 383, wbc 7.4.  Heavy irregular menstrual period since her bilateral tubal ligation. She was previously on Depo.   +  fatigue, SOB, craving ice chips.    INTERVAL HISTORY Sherry Mack is a 38 y.o. female who has above history reviewed by me today presents for follow up visit for iron  deficiency anemia.  She continues to have heavy menstrual period.  + fatigue has improved. Ice craving resolved.  She tolerates IV Venofer  treatments.   Review of Systems  Constitutional:  Positive for fatigue. Negative for appetite change, chills and fever.  HENT:   Negative for hearing loss and voice change.   Eyes:  Negative for eye problems.  Respiratory:  Negative for chest tightness, cough and shortness of breath.   Cardiovascular:  Negative for chest pain.  Gastrointestinal:  Negative for abdominal distention, abdominal pain and blood in stool.  Endocrine: Negative for hot flashes.  Genitourinary:  Positive for menstrual problem. Negative for difficulty urinating and frequency.   Musculoskeletal:  Negative for arthralgias.  Skin:  Negative for itching and rash.  Neurological:  Negative for extremity weakness.  Hematological:  Negative for adenopathy.  Psychiatric/Behavioral:  Negative for confusion.     MEDICAL HISTORY:  Past Medical History:  Diagnosis Date   Iron  deficiency anemia due to chronic blood loss 10/23/2021   STD (sexually transmitted disease)    20+  years ago- trich in pap 06/12/2016    SURGICAL HISTORY: Past Surgical History:  Procedure Laterality Date   TUBAL LIGATION      SOCIAL HISTORY: Social History   Socioeconomic History   Marital status: Single    Spouse name: Not on file   Number of children: Not on file   Years of education: Not on file   Highest education level: Not on file  Occupational History   Not on file  Tobacco Use   Smoking status: Never   Smokeless tobacco: Never  Vaping Use   Vaping status: Never Used  Substance and Sexual Activity   Alcohol use: Yes    Alcohol/week: 2.0 standard drinks of alcohol    Types: 2 Standard drinks or equivalent per week     Comment: occ   Drug use: Not Currently    Types: Marijuana    Comment: last use age 58   Sexual activity: Yes    Partners: Male    Birth control/protection: Surgical  Other Topics Concern   Not on file  Social History Narrative   Not on file   Social Drivers of Health   Financial Resource Strain: Not on file  Food Insecurity: Not on file  Transportation Needs: Not on file  Physical Activity: Not on file  Stress: Not on file  Social Connections: Not on file  Intimate Partner Violence: Not on file    FAMILY HISTORY: Family History  Problem Relation Age of Onset   Heart disease Mother    Stroke Maternal Grandmother    Heart disease Maternal Grandmother    Breast cancer Neg Hx    Ovarian cancer Neg Hx    Colon cancer Neg Hx    Cancer Neg Hx     ALLERGIES:  has no known allergies.  MEDICATIONS:  Current Outpatient Medications  Medication Sig Dispense Refill   ergocalciferol  (DRISDOL ) 1.25 MG (50000 UT) capsule Take 1 capsule (50,000 Units total) by mouth once a week. (Patient not taking: Reported on 03/29/2024) 4 capsule 3   ferrous sulfate  325 (65 FE) MG tablet TAKE 1 TABLET BY MOUTH EVERY DAY WITH BREAKFAST (Patient not taking: Reported on 03/29/2024) 90 tablet 1   Multiple Vitamin (MULTIVITAMIN) tablet Take 1 tablet by mouth daily. (Patient not taking: Reported on 03/29/2024)     Current Facility-Administered Medications  Medication Dose Route Frequency Provider Last Rate Last Admin   cyanocobalamin  ((VITAMIN B-12)) injection 1,000 mcg  1,000 mcg Intramuscular Once McDonough, Lauren K, PA-C       Facility-Administered Medications Ordered in Other Visits  Medication Dose Route Frequency Provider Last Rate Last Admin   iron  sucrose (VENOFER ) injection 200 mg  200 mg Intravenous Once Babara Call, MD         PHYSICAL EXAMINATION: ECOG PERFORMANCE STATUS: 1 - Symptomatic but completely ambulatory Vitals:   03/29/24 1424  BP: 124/82  Pulse: 88  Resp: 20  SpO2: 100%    Filed Weights   03/29/24 1424  Weight: 271 lb (122.9 kg)    Physical Exam Constitutional:      General: She is not in acute distress.    Appearance: She is obese.  HENT:     Head: Normocephalic and atraumatic.  Eyes:     General: No scleral icterus. Cardiovascular:     Rate and Rhythm: Normal rate and regular rhythm.  Pulmonary:     Effort: Pulmonary effort is normal. No respiratory distress.     Breath sounds: No wheezing.  Abdominal:  General: Bowel sounds are normal. There is no distension.     Palpations: Abdomen is soft.  Musculoskeletal:        General: No deformity. Normal range of motion.     Cervical back: Normal range of motion and neck supple.  Skin:    General: Skin is warm and dry.  Neurological:     General: No focal deficit present.     Mental Status: She is alert and oriented to Mack, place, and time. Mental status is at baseline.  Psychiatric:        Mood and Affect: Mood normal.     LABORATORY DATA:  I have reviewed the data as listed Lab Results  Component Value Date   WBC 10.5 03/11/2024   HGB 10.2 (L) 03/11/2024   HCT 32.4 (L) 03/11/2024   MCV 76.4 (L) 03/11/2024   PLT 320 03/11/2024   Recent Labs    06/19/23 1052  NA 140  K 4.7  CL 106  CO2 25  GLUCOSE 101*  BUN 12  CREATININE 0.80  CALCIUM 8.1*  PROT 5.0*  ALBUMIN 3.1*  AST 13  ALT 9  ALKPHOS 48  BILITOT <0.2   Iron /TIBC/Ferritin/ %Sat    Component Value Date/Time   IRON  19 (L) 03/11/2024 1519   IRON  11 (L) 06/19/2023 1052   TIBC 372 03/11/2024 1519   TIBC 305 06/19/2023 1052   FERRITIN 9 (L) 03/11/2024 1519   FERRITIN 7 (L) 06/19/2023 1052   IRONPCTSAT 5 (L) 03/11/2024 1519   IRONPCTSAT 4 (LL) 06/19/2023 1052      RADIOGRAPHIC STUDIES: I have personally reviewed the radiological images as listed and agreed with the findings in the report. No results found.

## 2024-03-29 NOTE — Assessment & Plan Note (Signed)
 Labs are reviewed and discussed with patient. Lab Results  Component Value Date   HGB 10.2 (L) 03/11/2024   TIBC 372 03/11/2024   IRONPCTSAT 5 (L) 03/11/2024   FERRITIN 9 (L) 03/11/2024    Today's lab showed improved Hb and panel, still decreased IV venofer  weekly x 4

## 2024-04-03 ENCOUNTER — Ambulatory Visit: Payer: Self-pay

## 2024-04-04 ENCOUNTER — Ambulatory Visit (HOSPITAL_COMMUNITY)
Admission: RE | Admit: 2024-04-04 | Discharge: 2024-04-04 | Disposition: A | Discharge status: Home / Self Care | Source: Ambulatory Visit | Attending: Internal Medicine | Admitting: Internal Medicine

## 2024-04-04 ENCOUNTER — Encounter (HOSPITAL_COMMUNITY): Payer: Self-pay

## 2024-04-04 VITALS — BP 99/67 | HR 86 | Temp 97.9°F | Resp 16

## 2024-04-04 DIAGNOSIS — R21 Rash and other nonspecific skin eruption: Secondary | ICD-10-CM | POA: Diagnosis present

## 2024-04-04 DIAGNOSIS — R350 Frequency of micturition: Secondary | ICD-10-CM | POA: Insufficient documentation

## 2024-04-04 DIAGNOSIS — N898 Other specified noninflammatory disorders of vagina: Secondary | ICD-10-CM | POA: Insufficient documentation

## 2024-04-04 LAB — POCT URINALYSIS DIP (MANUAL ENTRY)
Bilirubin, UA: NEGATIVE
Blood, UA: NEGATIVE
Glucose, UA: NEGATIVE mg/dL
Ketones, POC UA: NEGATIVE mg/dL
Leukocytes, UA: NEGATIVE
Nitrite, UA: NEGATIVE
Protein Ur, POC: NEGATIVE mg/dL
Spec Grav, UA: 1.015 (ref 1.010–1.025)
Urobilinogen, UA: 0.2 U/dL
pH, UA: 5.5 (ref 5.0–8.0)

## 2024-04-04 MED ORDER — TRIAMCINOLONE ACETONIDE 0.1 % EX CREA: 1.0000 | TOPICAL_CREAM | Freq: Two times a day (BID) | CUTANEOUS | 1 refills | Status: AC

## 2024-04-04 NOTE — Discharge Instructions (Addendum)
 Urinalysis done today was negative for infectious process. For the vaginal itching and discharge we have done a Screening swab today and results will be available in 24-48 hours. We will contact you if we need to arrange additional treatment based on your testing. Negative results will be on your MyChart account.     For the rash on the left elbow, we have prescribed the following:  Triamcinolone  cream twice daily to the affected area for itching/rash.  Do not apply this to the neck or face.   Return to urgent care or PCP if symptoms worsen or fail to resolve.

## 2024-04-04 NOTE — ED Triage Notes (Signed)
 Patient here today with c/o vaginal itching and discharge since Saturday. Patient has also had some urinary frequency since Saturday.

## 2024-04-04 NOTE — ED Provider Notes (Signed)
 [facility]    CSN: [id number] Arrival date & time: 04/04/24  1657      History   Chief Complaint Chief Complaint  Patient presents with   Vaginal Itching    Entered by patient    HPI Sherry Mack is a 38 y.o. female.   38 year old female who presents to urgent care with complaints of vaginal itching, irritation and vaginal discharge.  She also reports she has had some frequency.  Her symptoms started on Saturday.  She denies any dysuria or frank hematuria but has noticed a little blood on the tissue with wiping.  She denies any abdominal pain, fevers, chills, nausea, vomiting or other constitutional symptoms.  She reports that she has not had intercourse in a very long time and is not concerned for STDs.  She also notes developing a rash along her left arm at the [facility] fossa and upper arm that just started today.  It feels warm to the touch and is itchy.  She denies any known contacts to any allergens.     Vaginal Itching Pertinent negatives include no chest pain, no abdominal pain and no shortness of breath.    Past Medical History:  Diagnosis Date   Iron  deficiency anemia due to chronic blood loss 10/23/2021   STD (sexually transmitted disease)    20+ years ago- trich in pap 06/12/2016    Patient Active Problem List   Diagnosis Date Noted   Menorrhagia 08/10/2023   Iron  deficiency anemia due to chronic blood loss 10/23/2021   Trichomonas vaginalis infection 06/24/2016   Chronic fatigue 06/12/2016   Obesity, Class III, BMI 40-49.9 (morbid obesity) 248 lbs 06/12/2016   Dysmenorrhea 06/12/2016   Status post tubal ligation 2009 06/12/2016   Potential exposure to STD 06/12/2016    Past Surgical History:  Procedure Laterality Date   TUBAL LIGATION      OB History     Gravida  3   Para  3   Term  3   Preterm      AB      Living  3      SAB      IAB      Ectopic      Multiple      Live Births  3            Home Medications     Prior to Admission medications   Medication Sig Start Date End Date Taking? Authorizing Provider  triamcinolone  cream (KENALOG ) 0.1 % Apply 1 Application topically 2 (two) times daily. 04/04/24  Yes Teresa Almarie LABOR, PA-C    Family History Family History  Problem Relation Age of Onset   Heart disease Mother    Stroke Maternal Grandmother    Heart disease Maternal Grandmother    Breast cancer Neg Hx    Ovarian cancer Neg Hx    Colon cancer Neg Hx    Cancer Neg Hx     Social History Social History   Tobacco Use   Smoking status: Never   Smokeless tobacco: Never  Vaping Use   Vaping status: Never Used  Substance Use Topics   Alcohol use: Yes    Alcohol/week: 2.0 standard drinks of alcohol    Types: 2 Standard drinks or equivalent per week    Comment: occ   Drug use: Not Currently    Types: Marijuana    Comment: last use age 89     Allergies   Patient has no known allergies.  Review of Systems Review of Systems  Constitutional:  Negative for chills and fever.  HENT:  Negative for ear pain and sore throat.   Eyes:  Negative for pain and visual disturbance.  Respiratory:  Negative for cough and shortness of breath.   Cardiovascular:  Negative for chest pain and palpitations.  Gastrointestinal:  Negative for abdominal pain and vomiting.  Genitourinary:  Positive for frequency and vaginal discharge. Negative for dysuria and hematuria.       Vaginal itching  Musculoskeletal:  Negative for arthralgias and back pain.  Skin:  Positive for rash (left AC fossa). Negative for color change.  Neurological:  Negative for seizures and syncope.  All other systems reviewed and are negative.    Physical Exam Triage Vital Signs ED Triage Vitals  Encounter Vitals Group     BP 04/04/24 1715 99/67     Girls Systolic BP Percentile --      Girls Diastolic BP Percentile --      Boys Systolic BP Percentile --      Boys Diastolic BP Percentile --      Pulse Rate 04/04/24 1715  86     Resp 04/04/24 1715 16     Temp 04/04/24 1715 97.9 F (36.6 C)     Temp Source 04/04/24 1715 Oral     SpO2 04/04/24 1715 96 %     Weight --      Height --      Head Circumference --      Peak Flow --      Pain Score 04/04/24 1711 0     Pain Loc --      Pain Education --      Exclude from Growth Chart --    No data found.  Updated Vital Signs BP 99/67 (BP Location: Left Arm)   Pulse 86   Temp 97.9 F (36.6 C) (Oral)   Resp 16   LMP 03/22/2024 (Approximate)   SpO2 96%   Visual Acuity Right Eye Distance:   Left Eye Distance:   Bilateral Distance:    Right Eye Near:   Left Eye Near:    Bilateral Near:     Physical Exam Vitals and nursing note reviewed.  Constitutional:      General: She is not in acute distress.    Appearance: She is well-developed.  HENT:     Head: Normocephalic and atraumatic.  Eyes:     Conjunctiva/sclera: Conjunctivae normal.  Cardiovascular:     Rate and Rhythm: Normal rate and regular rhythm.     Heart sounds: No murmur heard. Pulmonary:     Effort: Pulmonary effort is normal. No respiratory distress.     Breath sounds: Normal breath sounds.  Abdominal:     General: Bowel sounds are normal.     Palpations: Abdomen is soft.     Tenderness: There is no abdominal tenderness.  Musculoskeletal:        General: No swelling.     Cervical back: Neck supple.  Skin:    General: Skin is warm and dry.     Capillary Refill: Capillary refill takes less than 2 seconds.  Neurological:     Mental Status: She is alert.  Psychiatric:        Mood and Affect: Mood normal.      UC Treatments / Results  Labs (all labs ordered are listed, but only abnormal results are displayed) Labs Reviewed  URINE CULTURE  POCT URINALYSIS DIP (MANUAL ENTRY)  CERVICOVAGINAL ANCILLARY  ONLY    EKG   Radiology No results found.  Procedures Procedures (including critical care time)  Medications Ordered in UC Medications - No data to  display  Initial Impression / Assessment and Plan / UC Course  I have reviewed the triage vital signs and the nursing notes.  Pertinent labs & imaging results that were available during my care of the patient were reviewed by me and considered in my medical decision making (see chart for details).     Rash and nonspecific skin eruption  Vaginal discharge  Vaginal itching  Urinary frequency   Urinalysis done today was negative for infectious process. For the vaginal itching and discharge we have done a Screening swab today and results will be available in 24-48 hours. We will contact you if we need to arrange additional treatment based on your testing. Negative results will be on your MyChart account.     For the rash on the left elbow, we have prescribed the following:  Triamcinolone  cream twice daily to the affected area for itching/rash.  Do not apply this to the neck or face.   Return to urgent care or PCP if symptoms worsen or fail to resolve.    Final Clinical Impressions(s) / UC Diagnoses   Final diagnoses:  Rash and nonspecific skin eruption  Vaginal discharge  Vaginal itching  Urinary frequency     Discharge Instructions      Urinalysis done today was negative for infectious process. For the vaginal itching and discharge we have done a Screening swab today and results will be available in 24-48 hours. We will contact you if we need to arrange additional treatment based on your testing. Negative results will be on your MyChart account.     For the rash on the left elbow, we have prescribed the following:  Triamcinolone  cream twice daily to the affected area for itching/rash.  Do not apply this to the neck or face.   Return to urgent care or PCP if symptoms worsen or fail to resolve.       ED Prescriptions     Medication Sig Dispense Auth. Provider   triamcinolone  cream (KENALOG ) 0.1 % Apply 1 Application topically 2 (two) times daily. 80 g Teresa Almarie LABOR,  [place]      PDMP not reviewed this encounter.   Teresa Almarie LABOR, PA-C 04/04/24 1827

## 2024-04-05 LAB — CERVICOVAGINAL ANCILLARY ONLY
Bacterial Vaginitis (gardnerella): NEGATIVE
Candida Glabrata: NEGATIVE
Candida Vaginitis: POSITIVE — AB
Chlamydia: NEGATIVE
Comment: NEGATIVE
Comment: NEGATIVE
Comment: NEGATIVE
Comment: NEGATIVE
Comment: NEGATIVE
Comment: NORMAL
Neisseria Gonorrhea: NEGATIVE
Trichomonas: NEGATIVE

## 2024-04-06 ENCOUNTER — Ambulatory Visit (HOSPITAL_COMMUNITY): Payer: Self-pay

## 2024-04-06 ENCOUNTER — Encounter: Payer: Self-pay | Admitting: Physician Assistant

## 2024-04-06 LAB — URINE CULTURE: Culture: <10000 — AB

## 2024-04-06 MED ORDER — FLUCONAZOLE 150 MG PO TABS
150.0000 mg | ORAL_TABLET | Freq: Once | ORAL | 0 refills | Status: AC
Start: 2024-04-06 — End: 2024-04-06

## 2024-04-07 ENCOUNTER — Inpatient Hospital Stay

## 2024-04-07 VITALS — BP 110/59 | HR 78 | Temp 96.4°F | Resp 18

## 2024-04-07 DIAGNOSIS — D500 Iron deficiency anemia secondary to blood loss (chronic): Secondary | ICD-10-CM | POA: Diagnosis not present

## 2024-04-07 MED ORDER — IRON SUCROSE 20 MG/ML IV SOLN
200.0000 mg | Freq: Once | INTRAVENOUS | Status: AC
  Administered 2024-04-07: 200 mg via INTRAVENOUS
  Filled 2024-04-07: qty 10

## 2024-04-07 MED ORDER — SODIUM CHLORIDE 0.9% FLUSH
10.0000 mL | Freq: Once | INTRAVENOUS | Status: AC | PRN
Start: 2024-04-07 — End: 2024-04-07
  Administered 2024-04-07: 10 mL
  Filled 2024-04-07: qty 10

## 2024-04-08 ENCOUNTER — Inpatient Hospital Stay

## 2024-04-14 ENCOUNTER — Inpatient Hospital Stay

## 2024-04-15 ENCOUNTER — Inpatient Hospital Stay

## 2024-04-22 ENCOUNTER — Inpatient Hospital Stay: Attending: Oncology

## 2024-04-22 ENCOUNTER — Inpatient Hospital Stay

## 2024-04-22 VITALS — BP 114/75 | HR 75 | Temp 96.3°F | Resp 18

## 2024-04-22 DIAGNOSIS — N920 Excessive and frequent menstruation with regular cycle: Secondary | ICD-10-CM | POA: Insufficient documentation

## 2024-04-22 DIAGNOSIS — D500 Iron deficiency anemia secondary to blood loss (chronic): Secondary | ICD-10-CM | POA: Insufficient documentation

## 2024-04-22 MED ORDER — IRON SUCROSE 20 MG/ML IV SOLN
200.0000 mg | Freq: Once | INTRAVENOUS | Status: AC
Start: 2024-04-22 — End: 2024-04-22
  Administered 2024-04-22: 200 mg via INTRAVENOUS
  Filled 2024-04-22: qty 10

## 2024-04-22 MED ORDER — SODIUM CHLORIDE 0.9% FLUSH
10.0000 mL | Freq: Once | INTRAVENOUS | Status: AC | PRN
Start: 2024-04-22 — End: 2024-04-22
  Administered 2024-04-22: 10 mL
  Filled 2024-04-22: qty 10

## 2024-04-25 ENCOUNTER — Ambulatory Visit: Payer: Self-pay | Admitting: Physician Assistant

## 2024-05-31 ENCOUNTER — Telehealth: Payer: Self-pay | Admitting: *Deleted

## 2024-05-31 ENCOUNTER — Other Ambulatory Visit: Payer: Self-pay

## 2024-05-31 DIAGNOSIS — D500 Iron deficiency anemia secondary to blood loss (chronic): Secondary | ICD-10-CM

## 2024-05-31 NOTE — Telephone Encounter (Signed)
 The patient called today and says she needs more IV iron  last one was on 8/8 venofer .

## 2024-05-31 NOTE — Telephone Encounter (Signed)
 Per Dr. Babara, we can recheck labs and then decide if pt need additional iron  based on those labs.   Please contact pt to set up a lab appt (CBC, iron , ferritin)

## 2024-05-31 NOTE — Telephone Encounter (Signed)
 Pt scheduled for follow up in [facility] Nov. Please advise on moving up appts.

## 2024-06-10 ENCOUNTER — Inpatient Hospital Stay: Attending: Oncology

## 2024-06-10 DIAGNOSIS — N920 Excessive and frequent menstruation with regular cycle: Secondary | ICD-10-CM | POA: Insufficient documentation

## 2024-06-10 DIAGNOSIS — D500 Iron deficiency anemia secondary to blood loss (chronic): Secondary | ICD-10-CM | POA: Insufficient documentation

## 2024-06-10 LAB — IRON AND TIBC
Iron: <10 ug/dL — ABNORMAL LOW (ref 28–170)
TIBC: 332 ug/dL (ref 250–450)

## 2024-06-10 LAB — CBC (CANCER CENTER ONLY)
HCT: 30.3 % — ABNORMAL LOW (ref 36.0–46.0)
Hemoglobin: 9.5 g/dL — ABNORMAL LOW (ref 12.0–15.0)
MCH: 23.3 pg — ABNORMAL LOW (ref 26.0–34.0)
MCHC: 31.4 g/dL (ref 30.0–36.0)
MCV: 74.3 fL — ABNORMAL LOW (ref 80.0–100.0)
Platelet Count: 324 K/uL (ref 150–400)
RBC: 4.08 MIL/uL (ref 3.87–5.11)
RDW: 16.7 % — ABNORMAL HIGH (ref 11.5–15.5)
WBC Count: 10.2 K/uL (ref 4.0–10.5)
nRBC: 0 % (ref 0.0–0.2)

## 2024-06-10 LAB — FERRITIN: Ferritin: 5 ng/mL — ABNORMAL LOW (ref 11–307)

## 2024-06-15 ENCOUNTER — Ambulatory Visit: Payer: Self-pay | Admitting: Oncology

## 2024-06-16 ENCOUNTER — Telehealth: Payer: Self-pay | Admitting: Physician Assistant

## 2024-06-16 NOTE — Telephone Encounter (Signed)
 Left vm and sent mychart message to confirm 06/23/24 appointment-Toni

## 2024-06-16 NOTE — Telephone Encounter (Signed)
-----   Message from Zelphia Cap sent at 06/15/2024 11:57 PM EDT ----- Lab results are consistent with IDA. Please arrange her to get Venofer  weekly x 3 Keep same follow up appt ----- Message ----- From: Interface, Lab In [place] Sent: 06/10/2024   3:53 PM EDT To: Zelphia Cap, MD

## 2024-06-20 ENCOUNTER — Encounter: Payer: Self-pay | Admitting: Physician Assistant

## 2024-06-23 ENCOUNTER — Encounter: Payer: Self-pay | Admitting: Physician Assistant

## 2024-06-24 ENCOUNTER — Inpatient Hospital Stay

## 2024-06-25 ENCOUNTER — Ambulatory Visit (HOSPITAL_COMMUNITY): Payer: Self-pay

## 2024-06-27 ENCOUNTER — Inpatient Hospital Stay: Attending: Oncology

## 2024-06-27 VITALS — BP 111/63 | HR 78 | Temp 97.3°F | Resp 17

## 2024-06-27 DIAGNOSIS — D500 Iron deficiency anemia secondary to blood loss (chronic): Secondary | ICD-10-CM | POA: Diagnosis present

## 2024-06-27 DIAGNOSIS — N920 Excessive and frequent menstruation with regular cycle: Secondary | ICD-10-CM | POA: Insufficient documentation

## 2024-06-27 MED ORDER — IRON SUCROSE 20 MG/ML IV SOLN
200.0000 mg | Freq: Once | INTRAVENOUS | Status: AC
  Administered 2024-06-27: 200 mg via INTRAVENOUS
  Filled 2024-06-27: qty 10

## 2024-07-01 ENCOUNTER — Inpatient Hospital Stay

## 2024-07-07 ENCOUNTER — Inpatient Hospital Stay

## 2024-07-08 ENCOUNTER — Inpatient Hospital Stay

## 2024-07-08 VITALS — BP 110/70 | HR 79 | Temp 97.7°F | Resp 16

## 2024-07-08 DIAGNOSIS — D500 Iron deficiency anemia secondary to blood loss (chronic): Secondary | ICD-10-CM

## 2024-07-08 MED ORDER — IRON SUCROSE 20 MG/ML IV SOLN
200.0000 mg | Freq: Once | INTRAVENOUS | Status: AC
  Administered 2024-07-08: 200 mg via INTRAVENOUS
  Filled 2024-07-08: qty 10

## 2024-07-08 NOTE — Patient Instructions (Signed)

## 2024-07-13 ENCOUNTER — Inpatient Hospital Stay

## 2024-07-18 ENCOUNTER — Inpatient Hospital Stay

## 2024-07-20 ENCOUNTER — Inpatient Hospital Stay: Attending: Oncology

## 2024-07-21 ENCOUNTER — Telehealth: Payer: Self-pay | Admitting: Physician Assistant

## 2024-07-21 NOTE — Telephone Encounter (Signed)
 Left vm and sent mychart message to confirm 07/28/24 appointment-Toni

## 2024-07-27 ENCOUNTER — Telehealth: Payer: Self-pay | Admitting: Oncology

## 2024-07-27 NOTE — Telephone Encounter (Signed)
 Per secure chat pt had left vm to r/s an iron  infusion.    I called pt back and left a vm that I was calling regarding the above. Scheduling phone number provided for pt to call back.

## 2024-07-28 ENCOUNTER — Encounter: Payer: Self-pay | Admitting: Physician Assistant

## 2024-07-28 ENCOUNTER — Ambulatory Visit (INDEPENDENT_AMBULATORY_CARE_PROVIDER_SITE_OTHER): Payer: Self-pay | Admitting: Physician Assistant

## 2024-07-28 ENCOUNTER — Encounter: Payer: Self-pay | Admitting: Oncology

## 2024-07-28 VITALS — BP 130/70 | HR 83 | Temp 98.0°F | Resp 16 | Ht 68.0 in | Wt 277.0 lb

## 2024-07-28 DIAGNOSIS — Z6841 Body Mass Index (BMI) 40.0 and over, adult: Secondary | ICD-10-CM

## 2024-07-28 DIAGNOSIS — Z0001 Encounter for general adult medical examination with abnormal findings: Secondary | ICD-10-CM

## 2024-07-28 DIAGNOSIS — D500 Iron deficiency anemia secondary to blood loss (chronic): Secondary | ICD-10-CM

## 2024-07-28 DIAGNOSIS — Z113 Encounter for screening for infections with a predominantly sexual mode of transmission: Secondary | ICD-10-CM | POA: Diagnosis not present

## 2024-07-28 DIAGNOSIS — R300 Dysuria: Secondary | ICD-10-CM

## 2024-07-28 NOTE — Progress Notes (Signed)
 [facility] [address] [place], [place] [address]  Internal MEDICINE  Office Visit Note  Patient Name: Sherry Mack  [id number]  [id number]  Date of Service: 07/28/2024  Chief Complaint  Patient presents with   Annual Exam     HPI Pt is here for routine health maintenance examination -requests STI screening today, no other concerns -Pap due in 2027 -Getting iron  infusions, has labs with outside providers -undergoing bariatric surgery evaluation -  Current Medication: Outpatient Encounter Medications as of 07/28/2024  Medication Sig   triamcinolone  cream (KENALOG ) 0.1 % Apply 1 Application topically 2 (two) times daily.   Vitamin D , Ergocalciferol , (DRISDOL ) 1.25 MG (50000 UNIT) CAPS capsule Take 50,000 Units by mouth every 7 (seven) days.   Facility-Administered Encounter Medications as of 07/28/2024  Medication   cyanocobalamin  ((VITAMIN B-12)) injection 1,000 mcg    Surgical History: Past Surgical History:  Procedure Laterality Date   TUBAL LIGATION      Medical History: Past Medical History:  Diagnosis Date   Iron  deficiency anemia due to chronic blood loss 10/23/2021   STD (sexually transmitted disease)    20+ years ago- trich in pap 06/12/2016    Family History: Family History  Problem Relation Age of Onset   Heart disease Mother    Stroke Maternal Grandmother    Heart disease Maternal Grandmother    Breast cancer Neg Hx    Ovarian cancer Neg Hx    Colon cancer Neg Hx    Cancer Neg Hx       Review of Systems  Constitutional:  Negative for chills, fatigue and unexpected weight change.  HENT:  Negative for congestion, postnasal drip, rhinorrhea, sneezing and sore throat.   Eyes:  Negative for redness.  Respiratory:  Negative for cough, chest tightness and shortness of breath.   Cardiovascular:  Negative for chest pain and palpitations.  Gastrointestinal:  Negative for abdominal pain, constipation, diarrhea, nausea and vomiting.   Genitourinary:  Negative for dysuria and frequency.  Musculoskeletal:  Negative for arthralgias, back pain, joint swelling and neck pain.  Skin:  Negative for rash.  Neurological: Negative.  Negative for tremors and numbness.  Hematological:  Negative for adenopathy. Does not bruise/bleed easily.  Psychiatric/Behavioral:  Negative for behavioral problems (Depression), sleep disturbance and suicidal ideas. The patient is not nervous/anxious.      Vital Signs: BP 130/70   Pulse 83   Temp 98 F (36.7 C)   Resp 16   Ht 5' 8 (1.727 m)   Wt 277 lb (125.6 kg)   SpO2 98%   BMI 42.12 kg/m    Physical Exam Vitals and nursing note reviewed.  Constitutional:      General: She is not in acute distress.    Appearance: She is well-developed. She is obese. She is not diaphoretic.  HENT:     Head: Normocephalic and atraumatic.     Mouth/Throat:     Pharynx: No oropharyngeal exudate.  Eyes:     Pupils: Pupils are equal, round, and reactive to light.  Neck:     Thyroid: No thyromegaly.     Vascular: No JVD.     Trachea: No tracheal deviation.  Cardiovascular:     Rate and Rhythm: Normal rate and regular rhythm.     Heart sounds: Normal heart sounds. No murmur heard.    No friction rub. No gallop.  Pulmonary:     Effort: Pulmonary effort is normal. No respiratory distress.     Breath sounds: No  wheezing or rales.  Chest:     Chest wall: No tenderness.  Breasts:    Right: Normal. No nipple discharge.     Left: Normal. No nipple discharge.  Abdominal:     General: Bowel sounds are normal.     Palpations: Abdomen is soft.     Tenderness: There is no abdominal tenderness.  Musculoskeletal:        General: Normal range of motion.     Cervical back: Normal range of motion and neck supple.  Lymphadenopathy:     Cervical: No cervical adenopathy.  Skin:    General: Skin is warm and dry.  Neurological:     Mental Status: She is alert and oriented to person, place, and time.   Psychiatric:        Behavior: Behavior normal.        Thought Content: Thought content normal.        Judgment: Judgment normal.      LABS: Recent Results (from the past 2160 hours)  Ferritin     Status: Abnormal   Collection Time: 06/10/24  3:45 PM  Result Value Ref Range   Ferritin 5 (L) 11 - 307 ng/mL    Comment: Performed at [facility], [address] [place], [place] [address]  Iron  and TIBC     Status: Abnormal   Collection Time: 06/10/24  3:45 PM  Result Value Ref Range   Iron  <10 (L) 28 - 170 ug/dL   TIBC 667 749 - 549 ug/dL   Saturation Ratios NOT CALCULATED 10.4 - 31.8 %    Comment: NOT CALCULATED   UIBC NOT CALCULATED ug/dL    Comment: NOT CALCULATED Performed at [facility], [address] [place], [place] [address]   CBC (Cancer Center Only)     Status: Abnormal   Collection Time: 06/10/24  3:45 PM  Result Value Ref Range   WBC Count 10.2 4.0 - 10.5 K/uL   RBC 4.08 3.87 - 5.11 MIL/uL   Hemoglobin 9.5 (L) 12.0 - 15.0 g/dL    Comment: Reticulocyte Hemoglobin testing may be clinically indicated, consider ordering this additional test [id number]    HCT 30.3 (L) 36.0 - 46.0 %   MCV 74.3 (L) 80.0 - 100.0 fL   MCH 23.3 (L) 26.0 - 34.0 pg   MCHC 31.4 30.0 - 36.0 g/dL   RDW 83.2 (H) 88.4 - 84.4 %   Platelet Count 324 150 - 400 K/uL   nRBC 0.0 0.0 - 0.2 %    Comment: Performed at [facility], [address] [place], [place] [address]  UA/M w/rflx Culture, Routine     Status: Abnormal   Collection Time: 07/28/24  3:38 PM   Specimen: Urine   Urine  Result Value Ref Range   Specific Gravity, UA 1.027 1.005 - 1.030   pH, UA 6.5 5.0 - 7.5   Color, UA Yellow Yellow   Appearance Ur Clear Clear   Leukocytes,UA Negative Negative   Protein,UA Trace Negative/Trace   Glucose, UA Negative Negative   Ketones, UA Negative Negative   RBC, UA 3+ (A) Negative   Bilirubin, UA Negative Negative   Urobilinogen, Ur 1.0 0.2 - 1.0 mg/dL    Nitrite, UA Negative Negative   Microscopic Examination See below:     Comment: Microscopic was indicated and was performed.   Urinalysis Reflex Comment     Comment: This specimen will not reflex to a Urine Culture.  NuSwab Vaginitis Plus (VG+)  Status: Abnormal   Collection Time: 07/28/24  3:38 PM  Result Value Ref Range   Atopobium vaginae High - 2 (A) Score   BVAB 2 High - 2 (A) Score   Megasphaera 1 High - 2 (A) Score    Comment: Calculate total score by adding the 3 individual bacterial vaginosis (BV) marker scores together.  Total score is interpreted as follows: Total score 0-1: Indicates the absence of BV. Total score   2: Indeterminate for BV. Additional clinical                  data should be evaluated to establish a                  diagnosis. Total score 3-6: Indicates the presence of BV.    Candida albicans, NAA Negative Negative   Candida glabrata, NAA Negative Negative   Trich vag by NAA Negative Negative   Chlamydia trachomatis, NAA Negative Negative   Neisseria gonorrhoeae, NAA Negative Negative  Microscopic Examination     Status: Abnormal   Collection Time: 07/28/24  3:38 PM   Urine  Result Value Ref Range   WBC, UA None seen 0 - 5 /hpf   RBC, Urine 11-30 (A) 0 - 2 /hpf   Epithelial Cells (non renal) 0-10 0 - 10 /hpf   Casts None seen None seen /lpf   Bacteria, UA Few None seen/Few        Assessment/Plan: 1. Encounter for general adult medical examination with abnormal findings (Primary) CPE performed, UTD on pap  2. Iron  deficiency anemia due to chronic blood loss Followed by hematology  3. Screening for STDs (sexually transmitted diseases) - NuSwab Vaginitis Plus (VG+)  4. Morbid obesity with BMI of 40.0-44.9, adult [facility]) Working with bariatric clinic for evaluation of bariatric surgery  5. Dysuria - UA/M w/rflx Culture, Routine   General Counseling: Karlie verbalizes understanding of the findings of todays visit and agrees with plan  of treatment. I have discussed any further diagnostic evaluation that may be needed or ordered today. We also reviewed her medications today. she has been encouraged to call the office with any questions or concerns that should arise related to todays visit.    Counseling:    Orders Placed This Encounter  Procedures   Microscopic Examination   UA/M w/rflx Culture, Routine   NuSwab Vaginitis Plus (VG+)    No orders of the defined types were placed in this encounter.   This patient was seen by Tinnie Pro, PA-C in collaboration with Dr. Sigrid Bathe as a part of collaborative care agreement.  Total time spent:35 Minutes  Time spent includes review of chart, medications, test results, and follow up plan with the patient.     Sigrid CHRISTELLA Bathe, MD  Internal Medicine

## 2024-07-29 ENCOUNTER — Telehealth: Payer: Self-pay | Admitting: Oncology

## 2024-07-29 ENCOUNTER — Inpatient Hospital Stay

## 2024-07-29 LAB — UA/M W/RFLX CULTURE, ROUTINE
Bilirubin, UA: NEGATIVE
Glucose, UA: NEGATIVE
Ketones, UA: NEGATIVE
Leukocytes,UA: NEGATIVE
Nitrite, UA: NEGATIVE
Specific Gravity, UA: 1.027 (ref 1.005–1.030)
Urobilinogen, Ur: 1 mg/dL (ref 0.2–1.0)
pH, UA: 6.5 (ref 5.0–7.5)

## 2024-07-29 LAB — MICROSCOPIC EXAMINATION
Casts: NONE SEEN /LPF
WBC, UA: NONE SEEN /HPF (ref 0–5)

## 2024-07-29 NOTE — Telephone Encounter (Signed)
 Received secure chat that pt needed to r/s lab appt - called pt to r/s, but left vm - LH

## 2024-08-01 LAB — NUSWAB VAGINITIS PLUS (VG+)
Atopobium vaginae: HIGH {score} — AB
BVAB 2: HIGH {score} — AB
Candida albicans, NAA: NEGATIVE
Candida glabrata, NAA: NEGATIVE
Megasphaera 1: HIGH {score} — AB

## 2024-08-02 ENCOUNTER — Ambulatory Visit: Payer: Self-pay | Admitting: Physician Assistant

## 2024-08-02 ENCOUNTER — Inpatient Hospital Stay

## 2024-08-02 ENCOUNTER — Inpatient Hospital Stay: Admitting: Oncology

## 2024-08-02 ENCOUNTER — Encounter: Payer: Self-pay | Admitting: Oncology

## 2024-08-03 ENCOUNTER — Other Ambulatory Visit: Payer: Self-pay

## 2024-08-03 MED ORDER — METRONIDAZOLE 500 MG PO TABS: 500.0000 mg | ORAL_TABLET | Freq: Two times a day (BID) | ORAL | 0 refills | Status: AC

## 2024-08-03 NOTE — Telephone Encounter (Signed)
-----   Message from Tinnie MARLA Pro sent at 08/02/2024  3:48 PM EST ----- Please let her know that nuswab did show BV and send metronidazole  BID x 7 days. Caution to avoid alcohol on this. Blood seen in urine, but patient reported being on menstrual cycle and is likely due  to this. ----- Message ----- From: Interface, [facility] Lab Results In Sent: 07/29/2024   9:36 AM EST To: Tinnie MARLA Pro, PA-C

## 2024-08-03 NOTE — Telephone Encounter (Signed)
 Patient notified, Metronidazole  sent.

## 2024-08-04 ENCOUNTER — Inpatient Hospital Stay

## 2024-08-04 VITALS — BP 119/69 | HR 74 | Temp 97.7°F | Resp 17

## 2024-08-04 DIAGNOSIS — D500 Iron deficiency anemia secondary to blood loss (chronic): Secondary | ICD-10-CM

## 2024-08-04 MED ORDER — IRON SUCROSE 20 MG/ML IV SOLN
200.0000 mg | Freq: Once | INTRAVENOUS | Status: AC
  Administered 2024-08-04: 200 mg via INTRAVENOUS

## 2024-09-20 ENCOUNTER — Inpatient Hospital Stay

## 2024-09-20 ENCOUNTER — Inpatient Hospital Stay: Admitting: Oncology

## 2024-09-20 ENCOUNTER — Telehealth: Payer: Self-pay | Admitting: Oncology

## 2024-09-20 NOTE — Telephone Encounter (Signed)
 Pt called and had death in family and needed to change appt - r/s appt w/pt - pt requested that she be able to have iron  infusion before next appt - told her I would message team - [facility]

## 2024-09-21 ENCOUNTER — Encounter: Payer: Self-pay | Admitting: Oncology

## 2024-10-20 ENCOUNTER — Ambulatory Visit (HOSPITAL_COMMUNITY): Payer: Self-pay

## 2024-10-21 ENCOUNTER — Encounter (HOSPITAL_COMMUNITY): Payer: Self-pay

## 2024-10-21 ENCOUNTER — Ambulatory Visit (HOSPITAL_COMMUNITY)
Admission: EM | Admit: 2024-10-21 | Discharge: 2024-10-21 | Disposition: A | Discharge status: Home / Self Care | Source: Home / Self Care

## 2024-10-21 ENCOUNTER — Ambulatory Visit (HOSPITAL_COMMUNITY)

## 2024-10-21 DIAGNOSIS — S0003XA Contusion of scalp, initial encounter: Secondary | ICD-10-CM

## 2024-10-21 DIAGNOSIS — S161XXA Strain of muscle, fascia and tendon at neck level, initial encounter: Secondary | ICD-10-CM

## 2024-10-21 MED ORDER — DICLOFENAC SODIUM 75 MG PO TBEC: 75.0000 mg | DELAYED_RELEASE_TABLET | Freq: Two times a day (BID) | ORAL | 0 refills | Status: AC

## 2024-10-21 MED ORDER — CYCLOBENZAPRINE HCL 5 MG PO TABS: 5.0000 mg | ORAL_TABLET | Freq: Three times a day (TID) | ORAL | 0 refills | Status: AC | PRN

## 2024-10-21 NOTE — Discharge Instructions (Addendum)
 Follow-up with primary care in 1 week if no improvement. Take medication as prescribed do not take cyclobenzaprine  with alcohol while driving as may cause drowsiness. Stretch neck shoulders daily. Apply ice and heat to neck and back 15 minutes 4 times a day.
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
## 2024-11-04 ENCOUNTER — Inpatient Hospital Stay

## 2024-11-07 ENCOUNTER — Inpatient Hospital Stay

## 2024-11-07 ENCOUNTER — Inpatient Hospital Stay: Admitting: Oncology

## 2025-07-31 ENCOUNTER — Encounter: Admitting: Physician Assistant
